# Patient Record
Sex: Female | Born: 1980 | Race: Black or African American | Hispanic: Yes | Marital: Single | Smoking: Never smoker
Health system: Southern US, Community
[De-identification: ages and names within clinical notes are randomized; demographics above are authoritative.]

## PROBLEM LIST (undated history)

## (undated) DIAGNOSIS — A630 Anogenital (venereal) warts: Secondary | ICD-10-CM

## (undated) DIAGNOSIS — R51 Headache: Secondary | ICD-10-CM

## (undated) DIAGNOSIS — I10 Essential (primary) hypertension: Secondary | ICD-10-CM

## (undated) DIAGNOSIS — K649 Unspecified hemorrhoids: Secondary | ICD-10-CM

## (undated) DIAGNOSIS — R519 Headache, unspecified: Secondary | ICD-10-CM

## (undated) DIAGNOSIS — F329 Major depressive disorder, single episode, unspecified: Secondary | ICD-10-CM

## (undated) DIAGNOSIS — K602 Anal fissure, unspecified: Secondary | ICD-10-CM

## (undated) DIAGNOSIS — D649 Anemia, unspecified: Secondary | ICD-10-CM

## (undated) DIAGNOSIS — F32A Depression, unspecified: Secondary | ICD-10-CM

## (undated) HISTORY — DX: Anal fissure, unspecified: K60.2

## (undated) HISTORY — PX: BREAST BIOPSY: SHX20

## (undated) HISTORY — PX: BREAST SURGERY: SHX581

## (undated) HISTORY — DX: Major depressive disorder, single episode, unspecified: F32.9

## (undated) HISTORY — DX: Depression, unspecified: F32.A

---

## 1999-02-12 DIAGNOSIS — K602 Anal fissure, unspecified: Secondary | ICD-10-CM

## 1999-02-12 HISTORY — PX: SIGMOIDOSCOPY: SUR1295

## 1999-02-12 HISTORY — DX: Anal fissure, unspecified: K60.2

## 2000-02-12 HISTORY — PX: WISDOM TOOTH EXTRACTION: SHX21

## 2003-08-22 ENCOUNTER — Inpatient Hospital Stay (HOSPITAL_COMMUNITY): Admission: AD | Admit: 2003-08-22 | Discharge: 2003-08-23 | Payer: Self-pay | Admitting: Psychiatry

## 2014-08-10 DIAGNOSIS — G43909 Migraine, unspecified, not intractable, without status migrainosus: Secondary | ICD-10-CM | POA: Insufficient documentation

## 2014-08-10 DIAGNOSIS — F319 Bipolar disorder, unspecified: Secondary | ICD-10-CM | POA: Insufficient documentation

## 2014-08-10 DIAGNOSIS — R748 Abnormal levels of other serum enzymes: Secondary | ICD-10-CM | POA: Insufficient documentation

## 2014-08-10 DIAGNOSIS — F329 Major depressive disorder, single episode, unspecified: Secondary | ICD-10-CM | POA: Insufficient documentation

## 2014-08-10 DIAGNOSIS — F32A Depression, unspecified: Secondary | ICD-10-CM | POA: Insufficient documentation

## 2014-08-10 DIAGNOSIS — B029 Zoster without complications: Secondary | ICD-10-CM | POA: Insufficient documentation

## 2014-08-12 ENCOUNTER — Ambulatory Visit (INDEPENDENT_AMBULATORY_CARE_PROVIDER_SITE_OTHER): Payer: Self-pay | Admitting: Family Medicine

## 2014-08-12 ENCOUNTER — Encounter: Payer: Self-pay | Admitting: Family Medicine

## 2014-08-12 VITALS — BP 122/80 | HR 70 | Temp 97.5°F | Resp 16 | Wt 165.8 lb

## 2014-08-12 DIAGNOSIS — H9203 Otalgia, bilateral: Secondary | ICD-10-CM

## 2014-08-12 NOTE — Progress Notes (Signed)
   Subjective:    Patient ID: Lisa Knox, female    DOB: August 10, 1980, 34 y.o.   MRN: [id number]  HPI Otalgia in right ear Sunday then moved to the left Monday. Been using OTC drops with relief of pain. Some itching remains. No hearing loss. Has continued Zyrtec for allergies (usually early spring symptoms). Temperature was up to 99.2 Tuesday (08-09-14).  Patient Active Problem List   Diagnosis Date Noted  . Affective bipolar disorder 08/10/2014  . Clinical depression 08/10/2014  . Abnormal liver enzymes 08/10/2014  . Herpes zona 08/10/2014  . Headache, migraine 08/10/2014   Past Surgical History  Procedure Laterality Date  . Wisdom tooth extraction  2002   History  Substance Use Topics  . Smoking status: Never Smoker   . Smokeless tobacco: Not on file  . Alcohol Use: 0.0 oz/week    0 Standard drinks or equivalent per week     Comment: OCCASIONALLY   Current Outpatient Prescriptions on File Prior to Visit  Medication Sig Dispense Refill  . lamoTRIgine (LAMICTAL) 100 MG tablet Take 2 tablets by mouth at bedtime.     No current facility-administered medications on file prior to visit.   Allergies  Allergen Reactions  . Pollen Extract Other (See Comments)    Pet dander- Hives  . Shellfish Allergy Hives       Review of Systems  Constitutional: Negative.   HENT: Positive for ear pain and hearing loss. Negative for ear discharge, postnasal drip, rhinorrhea and sneezing.   Eyes: Negative.   Respiratory: Negative.   Cardiovascular: Negative.   Gastrointestinal: Negative.       BP 122/80 mmHg  Pulse 70  Temp(Src) 97.5 F (36.4 C) (Oral)  Resp 16  Wt 165 lb 12.8 oz (75.206 kg)   Objective:   Physical Exam  Constitutional: She is oriented to person, place, and time. She appears well-developed and well-nourished. No distress.  HENT:  Head: Normocephalic and atraumatic.  Right Ear: Hearing normal.  Left Ear: Hearing normal.  Nose: Nose normal.  Eyes: Conjunctivae and  lids are normal. Right eye exhibits no discharge. Left eye exhibits no discharge. No scleral icterus.  Pulmonary/Chest: Effort normal. No respiratory distress.  Musculoskeletal: Normal range of motion.  Neurological: She is alert and oriented to person, place, and time.  Skin: Skin is intact. No lesion and no rash noted.  Psychiatric: She has a normal mood and affect. Her speech is normal and behavior is normal. Thought content normal.      Assessment & Plan:  1. Otalgia of both ears Recent sudden onset and quick recovery with use of OTC ear drops. Advised to watch for swimmer's ear and other congestion symptoms. May continue to use the drops, if needed, and use Zyrtec prn for allergy symptoms. Recheck prn.

## 2014-09-21 ENCOUNTER — Telehealth: Payer: Self-pay | Admitting: Family Medicine

## 2014-09-21 NOTE — Telephone Encounter (Signed)
Pt contacted office for refill request on the following medications: lamoTRIgine (LAMICTAL) 100 MG tablet [facility] in [place]. Thanks TNP

## 2014-09-21 NOTE — Telephone Encounter (Signed)
Please review. Thanks!  

## 2014-09-22 NOTE — Telephone Encounter (Signed)
Pt called back to see if this had been called in. Pt stated that she will be out tomorrow 09/23/14. Thanks TNP

## 2014-09-23 MED ORDER — LAMOTRIGINE 100 MG PO TABS: 200.0000 mg | ORAL_TABLET | Freq: Every day | ORAL | Status: DC

## 2014-10-03 ENCOUNTER — Ambulatory Visit (INDEPENDENT_AMBULATORY_CARE_PROVIDER_SITE_OTHER): Payer: Self-pay | Admitting: Family Medicine

## 2014-10-03 ENCOUNTER — Encounter: Payer: Self-pay | Admitting: Family Medicine

## 2014-10-03 VITALS — BP 110/68 | HR 64 | Temp 99.2°F | Resp 16 | Ht 66.0 in | Wt 168.0 lb

## 2014-10-03 DIAGNOSIS — F319 Bipolar disorder, unspecified: Secondary | ICD-10-CM

## 2014-10-03 DIAGNOSIS — R748 Abnormal levels of other serum enzymes: Secondary | ICD-10-CM

## 2014-10-03 MED ORDER — LAMOTRIGINE 100 MG PO TABS: 200.0000 mg | ORAL_TABLET | Freq: Every day | ORAL | Status: DC

## 2014-10-03 NOTE — Progress Notes (Signed)
Patient ID: Lisa Knox, female   DOB: 01-31-1981, 34 y.o.   MRN: [id number]        Patient: Lisa Knox Female    DOB: [birth year]   34 y.o.   MRN: [id number] Visit Date: 10/03/2014  Today's Provider: Margarita Rana, MD   Chief Complaint  Patient presents with  . Depression   Subjective:    HPI  Depression, Follow-up  She  was last seen for this 2 years ago. Changes made at last visit include none.   She reports excellent compliance with treatment. She is not having side effects.   She reports excellent tolerance of treatment. Current symptoms include: none She feels she is Improved since last visit.  ------------------------------------------------------------------------  Has not seen a gyn or had CPE in several years.      Allergies  Allergen Reactions  . Pollen Extract Other (See Comments)    Pet dander- Hives  . Shellfish Allergy Hives   Previous Medications   LAMOTRIGINE (LAMICTAL) 100 MG TABLET    Take 2 tablets (200 mg total) by mouth at bedtime.    Review of Systems  Social History  Substance Use Topics  . Smoking status: Never Smoker   . Smokeless tobacco: Never Used  . Alcohol Use: 0.0 oz/week    0 Standard drinks or equivalent per week     Comment: OCCASIONALLY   Objective:   BP 110/68 mmHg  Pulse 64  Temp(Src) 99.2 F (37.3 C) (Oral)  Resp 16  Ht 5\' 6"  (1.676 m)  Wt 168 lb (76.204 kg)  BMI 27.13 kg/m2  LMP 09/19/2014  Physical Exam  Constitutional: She is oriented to person, place, and time. She appears well-developed and well-nourished.  Cardiovascular: Normal rate.   Neurological: She is alert and oriented to person, place, and time.  Psychiatric: She has a normal mood and affect. Her behavior is normal. Judgment and thought content normal.        Assessment & Plan:     1. Bipolar affective disorder, most recent episode unspecified type, remission status unspecified Stable. Will continue medication and check labs.   Further plan  pending these results.   - TSH  2. Abnormal liver enzymes Mildly elevated in the past. Will recheck. Follow up for Wellness.  - Comprehensive metabolic panel - TSH - CBC with Differential/Platelet       Margarita Rana, MD  [facility]

## 2014-10-05 ENCOUNTER — Telehealth: Payer: Self-pay

## 2014-10-05 LAB — COMPREHENSIVE METABOLIC PANEL
ALBUMIN: 4.3 g/dL (ref 3.5–5.5)
ALT: 18 IU/L (ref 0–32)
AST: 18 IU/L (ref 0–40)
Albumin/Globulin Ratio: 1.1 (ref 1.1–2.5)
Alkaline Phosphatase: 80 IU/L (ref 39–117)
BUN / CREAT RATIO: 11 (ref 8–20)
BUN: 12 mg/dL (ref 6–20)
Bilirubin Total: 0.7 mg/dL (ref 0.0–1.2)
CO2: 23 mmol/L (ref 18–29)
CREATININE: 1.12 mg/dL — AB (ref 0.57–1.00)
Calcium: 9.5 mg/dL (ref 8.7–10.2)
Chloride: 96 mmol/L — ABNORMAL LOW (ref 97–108)
GFR calc Af Amer: 73 mL/min/{1.73_m2} (ref >59)
GFR calc non Af Amer: 63 mL/min/{1.73_m2} (ref >59)
GLUCOSE: 104 mg/dL — AB (ref 65–99)
Globulin, Total: 3.8 g/dL (ref 1.5–4.5)
Potassium: 4.9 mmol/L (ref 3.5–5.2)
Sodium: 137 mmol/L (ref 134–144)
Total Protein: 8.1 g/dL (ref 6.0–8.5)

## 2014-10-05 LAB — CBC WITH DIFFERENTIAL/PLATELET
BASOS ABS: 0 10*3/uL (ref 0.0–0.2)
Basos: 1 %
EOS (ABSOLUTE): 0.1 10*3/uL (ref 0.0–0.4)
Eos: 2 %
HEMOGLOBIN: 12.7 g/dL (ref 11.1–15.9)
Hematocrit: 38.2 % (ref 34.0–46.6)
IMMATURE GRANS (ABS): 0 10*3/uL (ref 0.0–0.1)
IMMATURE GRANULOCYTES: 0 %
Lymphocytes Absolute: 3 10*3/uL (ref 0.7–3.1)
Lymphs: 44 %
MCH: 29.3 pg (ref 26.6–33.0)
MCHC: 33.2 g/dL (ref 31.5–35.7)
MCV: 88 fL (ref 79–97)
MONOCYTES: 11 %
Monocytes Absolute: 0.7 10*3/uL (ref 0.1–0.9)
NEUTROS PCT: 42 %
Neutrophils Absolute: 2.8 10*3/uL (ref 1.4–7.0)
PLATELETS: 346 10*3/uL (ref 150–379)
RBC: 4.34 x10E6/uL (ref 3.77–5.28)
RDW: 13.7 % (ref 12.3–15.4)
WBC: 6.7 10*3/uL (ref 3.4–10.8)

## 2014-10-05 LAB — TSH: TSH: 1.57 u[IU]/mL (ref 0.450–4.500)

## 2014-10-05 NOTE — Telephone Encounter (Signed)
-----   Message from Margarita Rana, MD sent at 10/05/2014  6:45 AM EDT ----- Labs stable except glucose slightly elevated, but ok if not fasting. If not fasting, no need for further intervention. Thanks.

## 2014-10-05 NOTE — Telephone Encounter (Signed)
Advised pt of lab results. Pt verbally acknowledges understanding. Pt was NOT fasting. Advised pt glucose is fine. Renaldo Fiddler, CMA

## 2015-01-30 ENCOUNTER — Ambulatory Visit (INDEPENDENT_AMBULATORY_CARE_PROVIDER_SITE_OTHER): Payer: Self-pay | Admitting: Family Medicine

## 2015-01-30 ENCOUNTER — Encounter: Payer: Self-pay | Admitting: Family Medicine

## 2015-01-30 VITALS — BP 128/78 | HR 88 | Temp 99.2°F | Resp 16 | Ht 66.0 in | Wt 153.0 lb

## 2015-01-30 DIAGNOSIS — K629 Disease of anus and rectum, unspecified: Secondary | ICD-10-CM

## 2015-01-30 DIAGNOSIS — Z23 Encounter for immunization: Secondary | ICD-10-CM

## 2015-01-30 DIAGNOSIS — K602 Anal fissure, unspecified: Secondary | ICD-10-CM | POA: Insufficient documentation

## 2015-01-30 DIAGNOSIS — Z0000 Encounter for general adult medical examination without abnormal findings: Secondary | ICD-10-CM

## 2015-01-30 DIAGNOSIS — Z124 Encounter for screening for malignant neoplasm of cervix: Secondary | ICD-10-CM

## 2015-01-30 LAB — POCT URINALYSIS DIPSTICK
Bilirubin, UA: NEGATIVE
Blood, UA: NEGATIVE
Glucose, UA: NEGATIVE
KETONES UA: NEGATIVE
LEUKOCYTES UA: NEGATIVE
Nitrite, UA: NEGATIVE
PH UA: 6.5
Protein, UA: NEGATIVE
Spec Grav, UA: 1.01
Urobilinogen, UA: 0.2

## 2015-01-30 NOTE — Progress Notes (Signed)
Patient ID: Lisa Knox, female   DOB: Apr 22, 1980, 34 y.o.   MRN: [id number]         Patient: Lisa Knox, Female    DOB: 06-11-1980, 33 y.o.   MRN: [id number] Visit Date: 01/30/2015  Today's Provider: Margarita Rana, MD   Chief Complaint  Patient presents with  . Annual Exam   Subjective:    Annual physical exam Lisa Knox is a 34 y.o. female who presents today for health maintenance and complete physical. She feels fairly well. Having trouble with hemorrhoids.   She reports exercising regularly. She reports she is sleeping well.  -----------------------------------------------------------------   Review of Systems  Constitutional: Negative.   HENT: Positive for congestion and sinus pressure. Negative for dental problem, drooling, ear discharge, ear pain, facial swelling, hearing loss, mouth sores, nosebleeds, postnasal drip, rhinorrhea, sneezing, sore throat, tinnitus, trouble swallowing and voice change.   Eyes: Negative.   Respiratory: Negative.   Cardiovascular: Negative.   Gastrointestinal: Positive for anal bleeding and rectal pain. Negative for nausea, vomiting, abdominal pain, diarrhea, constipation, blood in stool and abdominal distention.  Endocrine: Negative.   Genitourinary: Negative.   Musculoskeletal: Negative.   Skin: Positive for rash. Negative for color change, pallor and wound.  Allergic/Immunologic: Positive for food allergies. Negative for environmental allergies and immunocompromised state.  Neurological: Positive for headaches. Negative for dizziness, tremors, seizures, syncope, facial asymmetry, speech difficulty, weakness, light-headedness and numbness.  Hematological: Negative.   Psychiatric/Behavioral: Negative.     Social History      She  reports that she has never smoked. She has never used smokeless tobacco. She reports that she drinks alcohol. She reports that she does not use illicit drugs.       Social History   Social History  . Marital  Status: Single    Spouse Name: N/A  . Number of Children: N/A  . Years of Education: N/A   Social History Main Topics  . Smoking status: Never Smoker   . Smokeless tobacco: Never Used  . Alcohol Use: 0.0 oz/week    0 Standard drinks or equivalent per week     Comment: OCCASIONALLY  . Drug Use: No  . Sexual Activity: Not Asked   Other Topics Concern  . None   Social History Narrative    Patient Active Problem List   Diagnosis Date Noted  . Affective bipolar disorder ([place]) 08/10/2014  . Clinical depression 08/10/2014  . Abnormal liver enzymes 08/10/2014  . Herpes zona 08/10/2014  . Headache, migraine 08/10/2014    Past Surgical History  Procedure Laterality Date  . Wisdom tooth extraction  2002    Family History        Family Status  Relation Status Death Age  . Mother Alive   . Father Alive   . Brother Alive   . Maternal Grandmother Deceased 64  . Maternal Grandfather Deceased   . Paternal Grandmother Deceased 9  . Paternal Grandfather Deceased 69  . Brother Deceased         Her family history includes Colon polyps in her father; Congestive Heart Failure in her maternal grandmother; Crohn's disease in her father; Dementia in her paternal grandfather; Depression in her mother; Diabetes in her paternal grandmother; GER disease in her mother; Gallbladder disease in her father; Hypertension in her brother and mother; Thyroid disease in her brother.    Allergies  Allergen Reactions  . Pollen Extract Other (See Comments)    Pet dander- Hives  . Shellfish Allergy Hives  Previous Medications   LAMOTRIGINE (LAMICTAL) 100 MG TABLET    Take 2 tablets (200 mg total) by mouth at bedtime.    Patient Care Team: Margarita Rana, MD as PCP - General (Family Medicine)     Objective:   Vitals: BP 128/78 mmHg  Pulse 88  Temp(Src) 99.2 F (37.3 C) (Oral)  Resp 16  Ht 5\' 6"  (1.676 m)  Wt 153 lb (69.4 kg)  BMI 24.71 kg/m2  LMP 01/23/2015 (Exact Date)   Physical  Exam  Constitutional: She is oriented to person, place, and time. She appears well-developed and well-nourished.  HENT:  Head: Normocephalic and atraumatic.  Right Ear: External ear normal.  Left Ear: External ear normal.  Nose: Nose normal.  Mouth/Throat: Oropharynx is clear and moist.  Eyes: Conjunctivae and EOM are normal. Pupils are equal, round, and reactive to light.  Neck: Normal range of motion. Neck supple. Carotid bruit is not present.  Cardiovascular: Normal rate, regular rhythm and normal heart sounds.   Pulmonary/Chest: Effort normal and breath sounds normal.  Abdominal: Soft. Bowel sounds are normal.  Genitourinary: Vagina normal and uterus normal. No breast swelling, tenderness, discharge or bleeding.  Rectum with inflamed, thickened tissue on left.     Musculoskeletal: Normal range of motion.  Neurological: She is alert and oriented to person, place, and time. She has normal reflexes.  Skin: Skin is warm and dry.  Psychiatric: She has a normal mood and affect. Her behavior is normal. Judgment and thought content normal.     Depression Screen PHQ 2/9 Scores 01/30/2015  PHQ - 2 Score 0      Assessment & Plan:     Routine Health Maintenance and Physical Exam  Exercise Activities and Dietary recommendations Goals    None      There is no immunization history for the selected administration types on file for this patient.  Health Maintenance  Topic Date Due  . HIV Screening  08/12/1993  . TETANUS/TDAP  08/12/1997  . PAP SMEAR  08/13/1999  . INFLUENZA VACCINE  09/12/2014      Discussed health benefits of physical activity, and encouraged her to engage in regular exercise appropriate for her age and condition.   1. Annual physical exam Stable, as above.  - POCT urinalysis dipstick Results for orders placed or performed in visit on 01/30/15  POCT urinalysis dipstick  Result Value Ref Range   Color, UA Yellow    Clarity, UA Clear    Glucose, UA  Negative    Bilirubin, UA Negative    Ketones, UA Negative    Spec Grav, UA 1.010    Blood, UA Negative    pH, UA 6.5    Protein, UA Negative    Urobilinogen, UA 0.2    Nitrite, UA Negative    Leukocytes, UA Negative Negative  .  2. Cervical cancer screening - Pap IG and HPV (high risk) DNA detection  3. Flu vaccine need Given today.  - Flu Vaccine QUAD 36+ mos PF IM (Fluarix & Fluzone Quad PF)  4. Anal lesion History of fissure, usual exam on near left rectum.   - Ambulatory referral to General Surgery   Patient was seen and examined by Jerrell Belfast, MD, and note scribed by Ashley Royalty, CMA.  I have reviewed the document for accuracy and completeness and I agree with above. Jerrell Belfast, MD   Margarita Rana, MD    --------------------------------------------------------------------

## 2015-02-03 ENCOUNTER — Telehealth: Payer: Self-pay

## 2015-02-03 LAB — PAP IG AND HPV HIGH-RISK
HPV, high-risk: NEGATIVE
PAP Smear Comment: 0

## 2015-02-03 NOTE — Telephone Encounter (Signed)
LMTCB 02/03/2015  Thanks,   -Mickel Baas

## 2015-02-03 NOTE — Telephone Encounter (Signed)
-----   Message from Margarita Rana, MD sent at 02/03/2015  7:29 AM EST ----- Pap is normal. Please notify patient.   Thanks.

## 2015-02-03 NOTE — Telephone Encounter (Signed)
Pt informed and voiced understanding of results. 

## 2015-02-07 ENCOUNTER — Ambulatory Visit (INDEPENDENT_AMBULATORY_CARE_PROVIDER_SITE_OTHER): Payer: PRIVATE HEALTH INSURANCE | Admitting: General Surgery

## 2015-02-07 ENCOUNTER — Encounter: Payer: Self-pay | Admitting: General Surgery

## 2015-02-07 VITALS — BP 120/74 | HR 78 | Resp 12 | Ht 66.0 in | Wt 151.0 lb

## 2015-02-07 DIAGNOSIS — K602 Anal fissure, unspecified: Secondary | ICD-10-CM | POA: Diagnosis not present

## 2015-02-07 DIAGNOSIS — K644 Residual hemorrhoidal skin tags: Secondary | ICD-10-CM | POA: Diagnosis not present

## 2015-02-07 NOTE — Patient Instructions (Addendum)
The patient is aware to call back for any questions or concerns. Anal Fissure, Adult An anal fissure is a small tear or crack in the skin around the anus. Bleeding from a fissure usually stops on its own within a few minutes. However, bleeding will often occur again with each bowel movement until the crack heals. CAUSES This condition may be caused by:  Passing large, hard stool (feces).  Frequent diarrhea.  Constipation.  Inflammatory bowel disease (Crohn disease or ulcerative colitis).  Infections.  Anal sex. SYMPTOMS Symptoms of this condition include:  Bleeding from the rectum.  Small amounts of blood seen on your stool, on toilet paper, or in the toilet after a bowel movement.  Painful bowel movements.  Itching or irritation around the anus. DIAGNOSIS A health care provider may diagnose this condition by closely examining the anal area. An anal fissure can usually be seen with careful inspection. In some cases, a rectal exam may be performed, or a short tube (anoscope) may be used to examine the anal canal. TREATMENT Treatment for this condition may include:  Taking steps to avoid constipation. This may include making changes to your diet, such as increasing your intake of fiber or fluid.  Taking fiber supplements. These supplements can soften your stool to help make bowel movements easier. Your health care provider may also prescribe a stool softener if your stool is often hard.  Taking sitz baths. This may help to heal the tear.  Using medicated creams or ointments. These may be prescribed to lessen discomfort. HOME CARE INSTRUCTIONS Eating and Drinking  Avoid foods that may be constipating, such as bananas and dairy products.  Drink enough fluid to keep your urine clear or pale yellow.  Maintain a diet that is high in fruits, whole grains, and vegetables. General Instructions  Keep the anal area as clean and dry as possible.  Take sitz baths as told by your  health care provider. Do not use soap in the sitz baths.  Take over-the-counter and prescription medicines only as told by your health care provider.  Use creams or ointments only as told by your health care provider.  Keep all follow-up visits as told by your health care provider. This is important. SEEK MEDICAL CARE IF:  You have more bleeding.  You have a fever.  You have diarrhea that is mixed with blood.  You continue to have pain.  Your problem is getting worse rather than better.   This information is not intended to replace advice given to you by your health care provider. Make sure you discuss any questions you have with your health care provider.   Document Released: 01/28/2005 Document Revised: 10/19/2014 Document Reviewed: 04/25/2014 Elsevier Interactive Patient Education 2016 [employer].   Patient's surgery has been scheduled for 03-03-15 at [facility].

## 2015-02-07 NOTE — Progress Notes (Addendum)
Patient ID: Lisa Knox, female   DOB: [birth year], 34 y.o.   MRN: [id number]  Chief Complaint  Patient presents with  . Rectal Problems    anal lesion    HPI Lisa Knox is a 34 y.o. female.  Here today for evaluation of a possible anal lesion or fissure. She has noticied some bleeding during the summer. The bleeding lasted 2-3 days. The most recent bleeding was yesterday. She has had an anal fissutre while in college noticed more when she was dehydrated and constipated. Bowels move daily. Denies rectal pain. She denies any abdominal issues. I have reviewed the history of present illness with the patient. HPI  Past Medical History  Diagnosis Date  . Depression   . Anal fissure 2001    Past Surgical History  Procedure Laterality Date  . Wisdom tooth extraction  2002  . Sigmoidoscopy  2001    Family History  Problem Relation Age of Onset  . Depression Mother   . Hypertension Mother   . GER disease Mother   . Colon polyps Father   . Gallbladder disease Father   . Crohn's disease Father   . Hypertension Brother   . Congestive Heart Failure Maternal Grandmother   . Diabetes Paternal Grandmother   . Dementia Paternal Grandfather   . Thyroid disease Brother     Social History Social History  Substance Use Topics  . Smoking status: Never Smoker   . Smokeless tobacco: Never Used  . Alcohol Use: 0.0 oz/week    0 Standard drinks or equivalent per week     Comment: OCCASIONALLY    Allergies  Allergen Reactions  . Pollen Extract Other (See Comments)    Pet dander- Hives  . Shellfish Allergy Hives    Current Outpatient Prescriptions  Medication Sig Dispense Refill  . lamoTRIgine (LAMICTAL) 100 MG tablet Take 2 tablets (200 mg total) by mouth at bedtime. 180 tablet 3   No current facility-administered medications for this visit.    Review of Systems Review of Systems  Constitutional: Negative.   Respiratory: Negative.   Cardiovascular: Negative.     Blood  pressure 120/74, pulse 78, resp. rate 12, height 5\' 6"  (1.676 m), weight 151 lb (68.493 kg), last menstrual period 01/23/2015.  Physical Exam Physical Exam  Constitutional: She is oriented to person, place, and time. She appears well-developed and well-nourished.  HENT:  Mouth/Throat: Oropharynx is clear and moist.  Eyes: Conjunctivae are normal. No scleral icterus.  Neck: Neck supple.  Cardiovascular: Normal rate, regular rhythm and normal heart sounds.   Pulmonary/Chest: Effort normal and breath sounds normal.  Abdominal: Soft. Normal appearance and bowel sounds are normal. There is no tenderness.  Genitourinary: Rectal exam shows fissure and anal tone abnormal.  Thickened anal skin tags 4 o'clock and 12 o'clock. Internal anterior anal fissure. Anal  tone is increased.  Neurological: She is alert and oriented to person, place, and time.  Skin: Skin is warm and dry.  Psychiatric: Her behavior is normal.   Anoscopy was performed since fissure could not be seen easily. 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 Rectal mucosa is normal.   Data Reviewed Progress notes.  Assessment    Anal fissure with multiple anal tags.    Plan    Discussed risk and benefits and recommend anal fissurotomy and sphinctectomy with excision of multiple anal tags. She is agreeable.    Patient's surgery has been scheduled for 03-03-15 at [facility]  This information has been scribed by Karie Fetch RNBC.  PCP:  Otho Darner 02/07/2015, 4:05 PM

## 2015-02-16 ENCOUNTER — Other Ambulatory Visit: Payer: Self-pay

## 2015-02-27 ENCOUNTER — Encounter: Payer: Self-pay | Admitting: *Deleted

## 2015-02-27 NOTE — Patient Instructions (Signed)
  Your procedure is scheduled on: 03-03-15  Report to [place] To find out your arrival time please call [phone] between 1PM - 3PM on 03-02-15   Remember: Instructions that are not followed completely may result in serious medical risk, up to and including death, or upon the discretion of your surgeon and anesthesiologist your surgery may need to be rescheduled.    _X___ 1. Do not eat food or drink liquids after midnight. No gum chewing or hard candies.     _X___ 2. No Alcohol for 24 hours before or after surgery.   ____ 3. Bring all medications with you on the day of surgery if instructed.    ____ 4. Notify your doctor if there is any change in your medical condition     (cold, fever, infections).     Do not wear jewelry, make-up, hairpins, clips or nail polish.  Do not wear lotions, powders, or perfumes. You may wear deodorant.  Do not shave 48 hours prior to surgery. Men may shave face and neck.  Do not bring valuables to the hospital.    [facility] is not responsible for any belongings or valuables.               Contacts, dentures or bridgework may not be worn into surgery.  Leave your suitcase in the car. After surgery it may be brought to your room.  For patients admitted to the hospital, discharge time is determined by your treatment team.   Patients discharged the day of surgery will not be allowed to drive home.   Please read over the following fact sheets that you were given:      ____ Take these medicines the morning of surgery with A SIP OF WATER:    1. NONE  2.   3.   4.  5.  6.  _X___ Fleet Enema (as directed)-DO ENEMA 1 HOUR PRIOR TO ARRIVAL TIME TO HOSPITAL  ____ Use CHG Soap as directed  ____ Use inhalers on the day of surgery  ____ Stop metformin 2 days prior to surgery    ____ Take 1/2 of usual insulin dose the night before surgery and none on the morning of surgery.   ____ Stop  Coumadin/Plavix/aspirin-N/A  ____ Stop Anti-inflammatories-NO NSAIDS OR ASA PRODUCTS-TYLENOL OK TO TAKE   ____ Stop supplements until after surgery.    ____ Bring C-Pap to the hospital.

## 2015-03-03 ENCOUNTER — Ambulatory Visit: Admission: RE | Admit: 2015-03-03 | Payer: Self-pay | Source: Ambulatory Visit | Admitting: General Surgery

## 2015-03-03 HISTORY — DX: Headache: R51

## 2015-03-03 HISTORY — DX: Headache, unspecified: R51.9

## 2015-03-03 SURGERY — ANAL FISTULOTOMY
Anesthesia: Choice

## 2015-10-12 ENCOUNTER — Other Ambulatory Visit: Payer: Self-pay | Admitting: Family Medicine

## 2015-10-12 NOTE — Telephone Encounter (Signed)
Pt contacted office for refill request on the following medications:  lamoTRIgine (LAMICTAL) 100 MG tablet.  [place].  CB#[phone]/MW

## 2015-10-13 ENCOUNTER — Telehealth: Payer: Self-pay | Admitting: Family Medicine

## 2015-10-13 ENCOUNTER — Other Ambulatory Visit: Payer: Self-pay | Admitting: Family Medicine

## 2015-10-13 NOTE — Telephone Encounter (Signed)
error 

## 2015-10-19 ENCOUNTER — Encounter: Payer: Self-pay | Admitting: Family Medicine

## 2015-10-19 ENCOUNTER — Ambulatory Visit (INDEPENDENT_AMBULATORY_CARE_PROVIDER_SITE_OTHER): Payer: Self-pay | Admitting: Family Medicine

## 2015-10-19 VITALS — BP 118/88 | HR 72 | Temp 98.6°F | Wt 138.6 lb

## 2015-10-19 DIAGNOSIS — F3170 Bipolar disorder, currently in remission, most recent episode unspecified: Secondary | ICD-10-CM

## 2015-10-19 MED ORDER — LAMOTRIGINE 100 MG PO TABS: 200.0000 mg | ORAL_TABLET | Freq: Every day | ORAL | 6 refills | Status: DC

## 2015-10-19 NOTE — Progress Notes (Signed)
Patient: Lisa Knox Female    DOB: 11/23/1980   35 y.o.   MRN: [id number] Visit Date: 10/19/2015  Today's Provider: [name], PA   Chief Complaint  Patient presents with  . Manic Behavior   Subjective:    HPI  Bipolar Disorder Follow Up: Patient is here for follow up and establish care with Lisa Knox since [name] left. Patient is doing well on current medication regimen. Patient needs refills. Tolerating Lamictal  Past Medical History:  Diagnosis Date  . Anal fissure 2001  . Depression   . Headache    H/O   Patient Active Problem List   Diagnosis Date Noted  . Anal lesion 01/30/2015  . Affective bipolar disorder ([place]) 08/10/2014  . Clinical depression 08/10/2014  . Abnormal liver enzymes 08/10/2014  . Herpes zona 08/10/2014  . Headache, migraine 08/10/2014   Past Surgical History:  Procedure Laterality Date  . SIGMOIDOSCOPY  2001  . WISDOM TOOTH EXTRACTION  2002   Family History  Problem Relation Age of Onset  . Depression Mother   . Hypertension Mother   . GER disease Mother   . Colon polyps Father   . Gallbladder disease Father   . Crohn's disease Father   . Hypertension Brother   . Congestive Heart Failure Maternal Grandmother   . Diabetes Paternal Grandmother   . Dementia Paternal Grandfather   . Thyroid disease Brother    Allergies  Allergen Reactions  . Pollen Extract Other (See Comments)    Pet dander- Hives  . Shellfish Allergy Hives   Previous Medications   LAMOTRIGINE (LAMICTAL) 100 MG TABLET    Take 2 tablets (200 mg total) by mouth at bedtime.   MULTIPLE VITAMIN (MULTIVITAMIN) TABLET    Take 1 tablet by mouth daily.   Review of Systems  Constitutional: Negative.   Respiratory: Negative.   Cardiovascular: Negative.   Psychiatric/Behavioral: Negative.    Social History  Substance Use Topics  . Smoking status: Never Smoker  . Smokeless tobacco: Never Used  . Alcohol use 0.0 oz/week     Comment: OCCASIONALLY    Objective:   BP 118/88 (BP Location: Right Arm, Patient Position: Sitting, Cuff Size: Normal)   Pulse 72   Temp 98.6 F (37 C) (Oral)   Wt 138 lb 9.6 oz (62.9 kg)   LMP 02/17/2015 (Exact Date)   BMI 22.37 kg/m   Physical Exam  Constitutional: She is oriented to person, place, and time. She appears well-developed and well-nourished. No distress.  HENT:  Head: Normocephalic and atraumatic.  Right Ear: Hearing normal.  Left Ear: Hearing normal.  Nose: Nose normal.  Eyes: Conjunctivae and lids are normal. Right eye exhibits no discharge. Left eye exhibits no discharge. No scleral icterus.  Pulmonary/Chest: Effort normal. No respiratory distress.  Musculoskeletal: Normal range of motion.  Neurological: She is alert and oriented to person, place, and time.  Skin: Skin is intact. No lesion and no rash noted.  Psychiatric: She has a normal mood and affect. Her speech is normal and behavior is normal. Thought content normal.      Assessment & Plan:     1. Bipolar disorder in partial remission, most recent episode unspecified type ([place]) Stable and well controlled with use of Lamictal daily. Tolerating medication without side effects. Does not have insurance to cover counseling or follow up with psychiatrist. Will schedule CPE in 4-6 months. - lamoTRIgine (LAMICTAL) 100 MG tablet; Take 2 tablets (200 mg total) by mouth at bedtime.  Dispense: 60 tablet; Refill: 6

## 2016-10-20 ENCOUNTER — Other Ambulatory Visit: Payer: Self-pay | Admitting: Family Medicine

## 2016-10-20 DIAGNOSIS — F3170 Bipolar disorder, currently in remission, most recent episode unspecified: Secondary | ICD-10-CM

## 2016-12-13 ENCOUNTER — Encounter: Payer: Self-pay | Admitting: Family Medicine

## 2016-12-20 ENCOUNTER — Encounter: Payer: Self-pay | Admitting: Family Medicine

## 2017-01-13 ENCOUNTER — Ambulatory Visit (INDEPENDENT_AMBULATORY_CARE_PROVIDER_SITE_OTHER): Payer: BC Managed Care – PPO | Admitting: Family Medicine

## 2017-01-13 ENCOUNTER — Encounter: Payer: Self-pay | Admitting: Family Medicine

## 2017-01-13 VITALS — BP 124/60 | HR 76 | Temp 98.4°F | Resp 16 | Ht 66.0 in | Wt 145.0 lb

## 2017-01-13 DIAGNOSIS — K602 Anal fissure, unspecified: Secondary | ICD-10-CM

## 2017-01-13 DIAGNOSIS — Z114 Encounter for screening for human immunodeficiency virus [HIV]: Secondary | ICD-10-CM

## 2017-01-13 DIAGNOSIS — Z23 Encounter for immunization: Secondary | ICD-10-CM

## 2017-01-13 DIAGNOSIS — Z0000 Encounter for general adult medical examination without abnormal findings: Secondary | ICD-10-CM

## 2017-01-13 LAB — LIPID PANEL
CHOL/HDL RATIO: 2.1 (calc) (ref <5.0)
Cholesterol: 165 mg/dL (ref <200)
HDL: 78 mg/dL (ref >50)
LDL CHOLESTEROL (CALC): 73 mg/dL
NON-HDL CHOLESTEROL (CALC): 87 mg/dL (ref <130)
Triglycerides: 61 mg/dL (ref <150)

## 2017-01-13 LAB — COMPLETE METABOLIC PANEL WITH GFR
AG Ratio: 1 (calc) (ref 1.0–2.5)
ALKALINE PHOSPHATASE (APISO): 58 U/L (ref 33–115)
ALT: 10 U/L (ref 6–29)
AST: 14 U/L (ref 10–30)
Albumin: 4.2 g/dL (ref 3.6–5.1)
BILIRUBIN TOTAL: 1.1 mg/dL (ref 0.2–1.2)
BUN: 18 mg/dL (ref 7–25)
CHLORIDE: 104 mmol/L (ref 98–110)
CO2: 28 mmol/L (ref 20–32)
CREATININE: 0.99 mg/dL (ref 0.50–1.10)
Calcium: 9.1 mg/dL (ref 8.6–10.2)
GFR, Est African American: 84 mL/min/{1.73_m2} (ref >=60)
GFR, Est Non African American: 72 mL/min/{1.73_m2} (ref >=60)
GLUCOSE: 87 mg/dL (ref 65–99)
Globulin: 4.2 g/dL (calc) — ABNORMAL HIGH (ref 1.9–3.7)
Potassium: 4.2 mmol/L (ref 3.5–5.3)
SODIUM: 141 mmol/L (ref 135–146)
Total Protein: 8.4 g/dL — ABNORMAL HIGH (ref 6.1–8.1)

## 2017-01-13 LAB — HIV ANTIBODY (ROUTINE TESTING W REFLEX): HIV 1&2 Ab, 4th Generation: NONREACTIVE

## 2017-01-13 LAB — CBC
HEMATOCRIT: 39 % (ref 35.0–45.0)
HEMOGLOBIN: 13.5 g/dL (ref 11.7–15.5)
MCH: 31.3 pg (ref 27.0–33.0)
MCHC: 34.6 g/dL (ref 32.0–36.0)
MCV: 90.3 fL (ref 80.0–100.0)
MPV: 10.3 fL (ref 7.5–12.5)
Platelets: 259 10*3/uL (ref 140–400)
RBC: 4.32 10*6/uL (ref 3.80–5.10)
RDW: 11.5 % (ref 11.0–15.0)
WBC: 4.3 10*3/uL (ref 3.8–10.8)

## 2017-01-13 NOTE — Progress Notes (Signed)
Patient: Lisa Knox, Female    DOB: 05-15-1980, 37 y.o.   MRN: [id number] Visit Date: 01/13/2017  Today's Provider: [name], MD   Chief Complaint  Patient presents with  . Annual Exam   Subjective:    Annual physical exam Lisa Knox is a 36 y.o. female who presents today for health maintenance and complete physical. She feels well. She reports she is not exercising regularly, but does make sure to always take the stairs. She reports she is sleeping well.  She does have anal fissures, which causes rectal bleeding. She was supposed to have surgery to correct this in January of this year, but canceled this due to insurance issues.  States that does bleed intermittently with constipation and straining, but no longer painful and much improved.  States she does not want to have surgery.  She has been told by her previous PCP that she has "dense breasts", and was supposed to get mammogram at the time of her last CPE. This was not performed, due to insurance.  Last pap- 01/30/2015- NIL; HPV negative.  Sexually active with boyfriend (only partner ever).  Using condoms.  Not trying to get pregnant, but would not be upset to get pregnant if it happened. -----------------------------------------------------------------   Review of Systems  Constitutional: Negative.   HENT: Positive for postnasal drip, rhinorrhea and sinus pain. Negative for congestion, dental problem, drooling, ear discharge, ear pain, facial swelling, hearing loss, mouth sores, nosebleeds, sinus pressure, sneezing, sore throat, tinnitus, trouble swallowing and voice change.   Eyes: Negative.   Respiratory: Negative.   Cardiovascular: Negative.   Gastrointestinal: Positive for blood in stool. Negative for abdominal distention, abdominal pain, anal bleeding, constipation, diarrhea, nausea, rectal pain and vomiting.  Genitourinary: Negative.   Musculoskeletal: Negative.   Skin: Negative.     Allergic/Immunologic: Positive for food allergies. Negative for environmental allergies and immunocompromised state.  Neurological: Negative.   Hematological: Negative.   Psychiatric/Behavioral: Negative.     Social History      She  reports that  has never smoked. she has never used smokeless tobacco. She reports that she drinks alcohol. She reports that she does not use drugs.       Social History   Socioeconomic History  . Marital status: Single    Spouse name: Not on file  . Number of children: 0  . Years of education: 74  . Highest education level: Master's degree (e.g., MA, MS, MEng, MEd, MSW, MBA)  Social Needs  . Financial resource strain: Not hard at all  . Food insecurity - worry: Never true  . Food insecurity - inability: Never true  . Transportation needs - medical: No  . Transportation needs - non-medical: No  Occupational History    Employer: [employer]  Tobacco Use  . Smoking status: Never Smoker  . Smokeless tobacco: Never Used  Substance and Sexual Activity  . Alcohol use: Yes    Alcohol/week: 0.0 oz    Comment: OCCASIONALLY  . Drug use: No  . Sexual activity: Yes    Birth control/protection: None  Other Topics Concern  . Not on file  Social History Narrative  . Not on file    Past Medical History:  Diagnosis Date  . Anal fissure 2001  . Depression   . Headache    H/O     Patient Active Problem List   Diagnosis Date Noted  . Anal fissure 01/30/2015  . Affective bipolar  disorder ([place]) 08/10/2014  . Clinical depression 08/10/2014  . Abnormal liver enzymes 08/10/2014  . Herpes zona 08/10/2014  . Headache, migraine 08/10/2014    Past Surgical History:  Procedure Laterality Date  . SIGMOIDOSCOPY  2001  . [place] EXTRACTION  2002    Family History        Family Status  Relation Name Status  . Mother  Alive  . Father  Alive  . Brother 1 Alive  . MGM  Deceased at age 47  . MGF  Deceased  . PGM  Deceased at age  54  . PGF  Deceased at age 64  . Brother 2 Alive        Her family history includes Colon polyps in her father; Congestive Heart Failure in her maternal grandmother; Crohn's disease in her father; Dementia in her paternal grandfather; Diabetes in her paternal grandmother; GER disease in her mother; Gallbladder disease in her father; Hypertension in her brother and mother; Thyroid disease in her brother.     Allergies  Allergen Reactions  . Pollen Extract Other (See Comments)    Pet dander- Hives  . Shellfish Allergy Hives     Current Outpatient Medications:  .  fluticasone (FLONASE) 50 MCG/ACT nasal spray, Place into both nostrils daily., Disp: , Rfl:  .  lamoTRIgine (LAMICTAL) 100 MG tablet, TAKE TWO TABLETS BY MOUTH AT BEDTIME, Disp: 60 tablet, Rfl: 2 .  Multiple Vitamin (MULTIVITAMIN) tablet, Take 1 tablet by mouth daily., Disp: , Rfl:    Patient Care Team: Virginia Crews, MD as PCP - General (Family Medicine) Christene Lye, MD (General Surgery)      Objective:   Vitals: BP 124/60 (BP Location: Left Arm, Patient Position: Sitting, Cuff Size: Normal)   Pulse 76   Temp 98.4 F (36.9 C) (Oral)   Resp 16   Ht 5\' 6"  (1.676 m)   Wt 145 lb (65.8 kg)   LMP 12/18/2016   BMI 23.40 kg/m    Vitals:   01/13/17 0912  BP: 124/60  Pulse: 76  Resp: 16  Temp: 98.4 F (36.9 C)  TempSrc: Oral  Weight: 145 lb (65.8 kg)  Height: 5\' 6"  (1.676 m)     Physical Exam  Constitutional: She is oriented to person, place, and time. She appears well-developed and well-nourished. No distress.  HENT:  Head: Normocephalic and atraumatic.  Right Ear: External ear normal.  Left Ear: External ear normal.  Nose: Nose normal.  Mouth/Throat: Oropharynx is clear and moist.  Eyes: Conjunctivae and EOM are normal. Pupils are equal, round, and reactive to light. No scleral icterus.  Neck: Neck supple. No thyromegaly present.  Cardiovascular: Normal rate, regular rhythm, normal heart  sounds and intact distal pulses.  No murmur heard. Pulmonary/Chest: Effort normal and breath sounds normal. No respiratory distress. She has no wheezes. She has no rales.  Breasts: breasts appear normal, no suspicious masses, no skin or nipple changes or axillary nodes.  Abdominal: Soft. Bowel sounds are normal. She exhibits no distension. There is no tenderness. There is no rebound and no guarding.  Musculoskeletal: She exhibits no edema or deformity.  Lymphadenopathy:    She has no cervical adenopathy.  Neurological: She is alert and oriented to person, place, and time.  Skin: Skin is warm and dry. No rash noted.  Psychiatric: She has a normal mood and affect. Her behavior is normal.  Vitals reviewed.    Depression Screen PHQ 2/9 Scores 01/13/2017 01/30/2015  PHQ - 2  Score 0 0      Assessment & Plan:     Routine Health Maintenance and Physical Exam  Exercise Activities and Dietary recommendations Goals    None      Immunization History  Administered Date(s) Administered  . Influenza,inj,Quad PF,6+ Mos 01/30/2015, 01/13/2017  . Tdap 10/28/2011    Health Maintenance  Topic Date Due  . HIV Screening  08/12/1993  . PAP SMEAR  01/29/2018  . TETANUS/TDAP  10/27/2021  . INFLUENZA VACCINE  Completed     Discussed health benefits of physical activity, and encouraged her to engage in regular exercise appropriate for her age and condition.    Problem List Items Addressed This Visit      Digestive   Anal fissure    Continue conservative treatment as it is improving Discussed measure to avoid constipation and stay regular       Other Visit Diagnoses    Encounter for annual physical exam    -  Primary   Relevant Orders   Comprehensive metabolic panel   CBC   Lipid panel   HIV antibody (with reflex)   Flu vaccine need       Relevant Orders   Flu Vaccine QUAD 36+ mos IM (Completed)   Encounter for screening for HIV       Relevant Orders   HIV antibody (with  reflex)      -------------------------------------------------------------------- Return in about 1 year (around 01/13/2018) for physical.   The entirety of the information documented in the History of Present Illness, Review of Systems and Physical Exam were personally obtained by me. Portions of this information were initially documented by Raquel Sarna Ratchford, CMA and reviewed by me for thoroughness and accuracy.     [name], MD  [facility]

## 2017-01-13 NOTE — Patient Instructions (Signed)
Preventive Care 18-39 Years, Female Preventive care refers to lifestyle choices and visits with your health care provider that can promote health and wellness. What does preventive care include?  A yearly physical exam. This is also called an annual well check.  Dental exams once or twice a year.  Routine eye exams. Ask your health care provider how often you should have your eyes checked.  Personal lifestyle choices, including: ? Daily care of your teeth and gums. ? Regular physical activity. ? Eating a healthy diet. ? Avoiding tobacco and drug use. ? Limiting alcohol use. ? Practicing safe sex. ? Taking vitamin and mineral supplements as recommended by your health care provider. What happens during an annual well check? The services and screenings done by your health care provider during your annual well check will depend on your age, overall health, lifestyle risk factors, and family history of disease. Counseling Your health care provider may ask you questions about your:  Alcohol use.  Tobacco use.  Drug use.  Emotional well-being.  Home and relationship well-being.  Sexual activity.  Eating habits.  Work and work Statistician.  Method of birth control.  Menstrual cycle.  Pregnancy history.  Screening You may have the following tests or measurements:  Height, weight, and BMI.  Diabetes screening. This is done by checking your blood sugar (glucose) after you have not eaten for a while (fasting).  Blood pressure.  Lipid and cholesterol levels. These may be checked every 5 years starting at age 66.  Skin check.  Hepatitis C blood test.  Hepatitis B blood test.  Sexually transmitted disease (STD) testing.  BRCA-related cancer screening. This may be done if you have a family history of breast, ovarian, tubal, or peritoneal cancers.  Pelvic exam and Pap test. This may be done every 3 years starting at age 40. Starting at age 59, this may be done every 5  years if you have a Pap test in combination with an HPV test.  Discuss your test results, treatment options, and if necessary, the need for more tests with your health care provider. Vaccines Your health care provider may recommend certain vaccines, such as:  Influenza vaccine. This is recommended every year.  Tetanus, diphtheria, and acellular pertussis (Tdap, Td) vaccine. You may need a Td booster every 10 years.  Varicella vaccine. You may need this if you have not been vaccinated.  HPV vaccine. If you are 69 or younger, you may need three doses over 6 months.  Measles, mumps, and rubella (MMR) vaccine. You may need at least one dose of MMR. You may also need a second dose.  Pneumococcal 13-valent conjugate (PCV13) vaccine. You may need this if you have certain conditions and were not previously vaccinated.  Pneumococcal polysaccharide (PPSV23) vaccine. You may need one or two doses if you smoke cigarettes or if you have certain conditions.  Meningococcal vaccine. One dose is recommended if you are age 27-21 years and a first-year college student living in a residence hall, or if you have one of several medical conditions. You may also need additional booster doses.  Hepatitis A vaccine. You may need this if you have certain conditions or if you travel or work in places where you may be exposed to hepatitis A.  Hepatitis B vaccine. You may need this if you have certain conditions or if you travel or work in places where you may be exposed to hepatitis B.  Haemophilus influenzae type b (Hib) vaccine. You may need this if  you have certain risk factors.  Talk to your health care provider about which screenings and vaccines you need and how often you need them. This information is not intended to replace advice given to you by your health care provider. Make sure you discuss any questions you have with your health care provider. Document Released: 03/26/2001 Document Revised: 10/18/2015  Document Reviewed: 11/29/2014 Elsevier Interactive Patient Education  2017 [employer].

## 2017-01-13 NOTE — Assessment & Plan Note (Signed)
Continue conservative treatment as it is improving Discussed measure to avoid constipation and stay regular

## 2017-01-14 ENCOUNTER — Telehealth: Payer: Self-pay

## 2017-01-14 NOTE — Telephone Encounter (Signed)
-----   Message from Virginia Crews, MD sent at 01/14/2017  9:08 AM EST ----- Normal Blood counts, kidney function, liver function, electrolytes, cholesterol.  Negative HIV screening  Bacigalupo, Dionne Bucy, MD, MPH [facility] 01/14/2017 9:08 AM

## 2017-01-14 NOTE — Telephone Encounter (Signed)
Left message advising pt. OK per DPR. 

## 2017-03-31 ENCOUNTER — Other Ambulatory Visit: Payer: Self-pay | Admitting: Family Medicine

## 2017-03-31 DIAGNOSIS — F3170 Bipolar disorder, currently in remission, most recent episode unspecified: Secondary | ICD-10-CM

## 2017-09-25 ENCOUNTER — Other Ambulatory Visit: Payer: Self-pay | Admitting: Family Medicine

## 2017-09-25 DIAGNOSIS — F3170 Bipolar disorder, currently in remission, most recent episode unspecified: Secondary | ICD-10-CM

## 2018-01-14 ENCOUNTER — Encounter: Payer: Self-pay | Admitting: Family Medicine

## 2018-01-14 ENCOUNTER — Ambulatory Visit: Payer: BC Managed Care – PPO | Admitting: Family Medicine

## 2018-01-14 VITALS — BP 122/72 | HR 95 | Temp 98.9°F | Ht 66.0 in | Wt 157.0 lb

## 2018-01-14 DIAGNOSIS — J069 Acute upper respiratory infection, unspecified: Secondary | ICD-10-CM | POA: Diagnosis not present

## 2018-01-14 DIAGNOSIS — Z23 Encounter for immunization: Secondary | ICD-10-CM | POA: Diagnosis not present

## 2018-01-14 NOTE — Progress Notes (Deleted)
Patient: Lisa Knox, Female    DOB: 1980-07-14, 37 y.o.   MRN: [id number] Visit Date: 01/14/2018  Today's Provider: [name], MD   Chief Complaint  Patient presents with  . Annual Exam   Subjective:    Annual physical exam Lisa Knox is a 37 y.o. female who presents today for health maintenance and complete physical. She feels {DESC; WELL/FAIRLY WELL/POORLY:18703}. She reports exercising ***. She reports she is sleeping {DESC; WELL/FAIRLY WELL/POORLY:18703}.  -----------------------------------------------------------------   Review of Systems  Constitutional: Negative.   HENT: Positive for rhinorrhea, sinus pressure and sore throat.   Eyes: Negative.   Respiratory: Positive for cough.   Cardiovascular: Negative.   Gastrointestinal: Negative.   Endocrine: Negative.   Genitourinary: Negative.   Musculoskeletal: Negative.   Skin: Negative.   Allergic/Immunologic: Positive for food allergies.  Neurological: Positive for headaches.  Hematological: Negative.   Psychiatric/Behavioral: Negative.     Social History      She  reports that she has never smoked. She has never used smokeless tobacco. She reports that she drinks alcohol. She reports that she does not use drugs.       Social History   Socioeconomic History  . Marital status: Single    Spouse name: Not on file  . Number of children: 0  . Years of education: 46  . Highest education level: Master's degree (e.g., MA, MS, MEng, MEd, MSW, MBA)  Occupational History    Employer: [employer]  Social Needs  . Financial resource strain: Not hard at all  . Food insecurity:    Worry: Never true    Inability: Never true  . Transportation needs:    Medical: No    Non-medical: No  Tobacco Use  . Smoking status: Never Smoker  . Smokeless tobacco: Never Used  Substance and Sexual Activity  . Alcohol use: Yes    Alcohol/week: 0.0 standard drinks    Comment: OCCASIONALLY  . Drug  use: No  . Sexual activity: Yes    Birth control/protection: None  Lifestyle  . Physical activity:    Days per week: 0 days    Minutes per session: 0 min  . Stress: Not on file  Relationships  . Social connections:    Talks on phone: Not on file    Gets together: Not on file    Attends religious service: Not on file    Active member of club or organization: Not on file    Attends meetings of clubs or organizations: Not on file    Relationship status: Not on file  Other Topics Concern  . Not on file  Social History Narrative  . Not on file    Past Medical History:  Diagnosis Date  . Anal fissure 2001  . Depression   . Headache    H/O     Patient Active Problem List   Diagnosis Date Noted  . Anal fissure 01/30/2015  . Affective bipolar disorder ([place]) 08/10/2014  . Clinical depression 08/10/2014  . Abnormal liver enzymes 08/10/2014  . Herpes zona 08/10/2014  . Headache, migraine 08/10/2014    Past Surgical History:  Procedure Laterality Date  . SIGMOIDOSCOPY  2001  . [place] EXTRACTION  2002    Family History        Family Status  Relation Name Status  . Mother  Alive  . Father  Alive  . Brother 1 Alive  . MGM  Deceased at age 22  .  MGF  Deceased  . PGM  Deceased at age 60  . PGF  Deceased at age 44  . Brother 2 Alive        Her family history includes Colon polyps in her father; Congestive Heart Failure in her maternal grandmother; Crohn's disease in her father; Dementia in her paternal grandfather; Diabetes in her paternal grandmother; GER disease in her mother; Gallbladder disease in her father; Hypertension in her brother and mother; Thyroid disease in her brother.      Allergies  Allergen Reactions  . Pollen Extract Other (See Comments)    Pet dander- Hives  . Shellfish Allergy Hives     Current Outpatient Medications:  .  fluticasone (FLONASE) 50 MCG/ACT nasal spray, Place into both nostrils daily., Disp: , Rfl:  .  lamoTRIgine  (LAMICTAL) 100 MG tablet, TAKE 2 TABLETS BY MOUTH ONCE DAILY AT BEDTIME, Disp: 60 tablet, Rfl: 2 .  Multiple Vitamin (MULTIVITAMIN) tablet, Take 1 tablet by mouth daily., Disp: , Rfl:    Patient Care Team: Virginia Crews, MD as PCP - General (Family Medicine) Christene Lye, MD (General Surgery)      Objective:   Vitals: There were no vitals taken for this visit.  There were no vitals filed for this visit.   Physical Exam   Depression Screen PHQ 2/9 Scores 01/14/2018 01/13/2017 01/30/2015  PHQ - 2 Score 0 0 0  PHQ- 9 Score 0 - -      Assessment & Plan:     Routine Health Maintenance and Physical Exam  Exercise Activities and Dietary recommendations Goals   None     Immunization History  Administered Date(s) Administered  . Influenza,inj,Quad PF,6+ Mos 01/30/2015, 01/13/2017  . Tdap 10/28/2011    Health Maintenance  Topic Date Due  . INFLUENZA VACCINE  09/11/2017  . PAP SMEAR  01/29/2018  . TETANUS/TDAP  10/27/2021  . HIV Screening  Completed     Discussed health benefits of physical activity, and encouraged her to engage in regular exercise appropriate for her age and condition.    --------------------------------------------------------------------    [name], MD  [place]

## 2018-01-14 NOTE — Progress Notes (Signed)
Patient: Lisa Knox Female    DOB: May 28, 1980   37 y.o.   MRN: [id number] Visit Date: 01/14/2018  Today's Provider: [name], MD   Chief Complaint  Patient presents with  . URI   Subjective:    URI   This is a new problem. Episode onset: Thursday. The problem has been gradually worsening. There has been no fever. Associated symptoms include congestion, coughing, headaches, rhinorrhea, sinus pain, sneezing and a sore throat. Pertinent negatives include no ear pain or wheezing. Treatments tried: Benadryl and Mucinex. The treatment provided mild relief.      Allergies  Allergen Reactions  . Pollen Extract Other (See Comments)    Pet dander- Hives  . Shellfish Allergy Hives     Current Outpatient Medications:  .  fluticasone (FLONASE) 50 MCG/ACT nasal spray, Place into both nostrils daily., Disp: , Rfl:  .  lamoTRIgine (LAMICTAL) 100 MG tablet, TAKE 2 TABLETS BY MOUTH ONCE DAILY AT BEDTIME, Disp: 60 tablet, Rfl: 2 .  Multiple Vitamin (MULTIVITAMIN) tablet, Take 1 tablet by mouth daily., Disp: , Rfl:   Review of Systems  Constitutional: Negative.   HENT: Positive for congestion, postnasal drip, rhinorrhea, sinus pressure, sinus pain, sneezing and sore throat. Negative for dental problem, ear pain, facial swelling, hearing loss, nosebleeds, trouble swallowing and voice change.   Eyes: Negative.   Respiratory: Positive for cough. Negative for apnea, choking, chest tightness, shortness of breath, wheezing and stridor.   Cardiovascular: Negative.   Gastrointestinal: Negative.   Genitourinary: Negative.   Musculoskeletal: Negative.   Neurological: Positive for headaches. Negative for dizziness, tremors, seizures, facial asymmetry, speech difficulty, weakness and light-headedness.  Psychiatric/Behavioral: Negative.     Social History   Tobacco Use  . Smoking status: Never Smoker  . Smokeless tobacco: Never Used  Substance Use Topics  . Alcohol use: Yes   Alcohol/week: 0.0 standard drinks    Comment: OCCASIONALLY   Objective:   BP 122/72 (BP Location: Right Arm, Patient Position: Sitting, Cuff Size: Normal)   Pulse 95   Temp 98.9 F (37.2 C) (Oral)   Ht 5\' 6"  (1.676 m)   Wt 157 lb (71.2 kg)   SpO2 97%   BMI 25.34 kg/m  Vitals:   01/14/18 0904  BP: 122/72  Pulse: 95  Temp: 98.9 F (37.2 C)  TempSrc: Oral  SpO2: 97%  Weight: 157 lb (71.2 kg)  Height: 5\' 6"  (1.676 m)     Physical Exam  Constitutional: She is oriented to person, place, and time. She appears well-developed and well-nourished. No distress.  HENT:  Head: Normocephalic and atraumatic.  Right Ear: Tympanic membrane, external ear and ear canal normal.  Left Ear: Tympanic membrane, external ear and ear canal normal.  Nose: Mucosal edema present. Right sinus exhibits maxillary sinus tenderness and frontal sinus tenderness. Left sinus exhibits maxillary sinus tenderness and frontal sinus tenderness.  Mouth/Throat: Uvula is midline, oropharynx is clear and moist and mucous membranes are normal. No oropharyngeal exudate, posterior oropharyngeal edema or posterior oropharyngeal erythema.  Eyes: Pupils are equal, round, and reactive to light. Conjunctivae are normal. Right eye exhibits no discharge. Left eye exhibits no discharge. No scleral icterus.  Neck: Neck supple. No thyromegaly present.  Cardiovascular: Normal rate, regular rhythm, normal heart sounds and intact distal pulses.  No murmur heard. Pulmonary/Chest: Effort normal and breath sounds normal. No respiratory distress. She has no wheezes. She has no rales.  Musculoskeletal: She exhibits no edema.  Lymphadenopathy:  She has no cervical adenopathy.  Neurological: She is alert and oriented to person, place, and time.  Skin: Skin is warm and dry. Capillary refill takes less than 2 seconds. No rash noted.  Psychiatric: She has a normal mood and affect. Her behavior is normal.  Vitals reviewed.         Assessment & Plan:   1. Viral URI - symptoms and exam c/w viral URI - no evidence of strep pharyngitis, CAP, AOM, bacterial sinusitis, or other bacterial infection - discussed symptomatic management, natural course, and return precautions  - given the sinus tenderness and pressure x6 days, after 2-3 days of treatment with Mucinex, decongestant, and nasal spray, could consider treatment for sinusitis given time course - she will call back if symptoms persist  2. Need for influenza vaccination - Flu Vaccine QUAD 6+ mos PF IM (Fluarix Quad PF)   Return if symptoms worsen or fail to improve.   The entirety of the information documented in the History of Present Illness, Review of Systems and Physical Exam were personally obtained by me. Portions of this information were initially documented by Tiburcio Pea, CMA and reviewed by me for thoroughness and accuracy.    Virginia Crews, MD, MPH [facility] 01/14/2018 9:41 AM

## 2018-01-14 NOTE — Patient Instructions (Signed)
Upper Respiratory Infection, Adult Most upper respiratory infections (URIs) are caused by a virus. A URI affects the nose, throat, and upper air passages. The most common type of URI is often called "the common cold." Follow these instructions at home:  Take medicines only as told by your doctor.  Gargle warm saltwater or take cough drops to comfort your throat as told by your doctor.  Use a warm mist humidifier or inhale steam from a shower to increase air moisture. This may make it easier to breathe.  Drink enough fluid to keep your pee (urine) clear or pale yellow.  Eat soups and other clear broths.  Have a healthy diet.  Rest as needed.  Go back to work when your fever is gone or your doctor says it is okay. ? You may need to stay home longer to avoid giving your URI to others. ? You can also wear a face mask and wash your hands often to prevent spread of the virus.  Use your inhaler more if you have asthma.  Do not use any tobacco products, including cigarettes, chewing tobacco, or electronic cigarettes. If you need help quitting, ask your doctor. Contact a doctor if:  You are getting worse, not better.  Your symptoms are not helped by medicine.  You have chills.  You are getting more short of breath.  You have brown or red mucus.  You have yellow or brown discharge from your nose.  You have pain in your face, especially when you bend forward.  You have a fever.  You have puffy (swollen) neck glands.  You have pain while swallowing.  You have white areas in the back of your throat. Get help right away if:  You have very bad or constant: ? Headache. ? Ear pain. ? Pain in your forehead, behind your eyes, and over your cheekbones (sinus pain). ? Chest pain.  You have long-lasting (chronic) lung disease and any of the following: ? Wheezing. ? Long-lasting cough. ? Coughing up blood. ? A change in your usual mucus.  You have a stiff neck.  You have  changes in your: ? Vision. ? Hearing. ? Thinking. ? Mood. This information is not intended to replace advice given to you by your health care provider. Make sure you discuss any questions you have with your health care provider. Document Released: 07/17/2007 Document Revised: 10/01/2015 Document Reviewed: 05/05/2013 Elsevier Interactive Patient Education  2018 Elsevier Inc.  

## 2018-03-03 ENCOUNTER — Encounter: Payer: BC Managed Care – PPO | Admitting: Family Medicine

## 2018-03-03 ENCOUNTER — Other Ambulatory Visit: Payer: Self-pay | Admitting: Family Medicine

## 2018-03-03 DIAGNOSIS — F3170 Bipolar disorder, currently in remission, most recent episode unspecified: Secondary | ICD-10-CM

## 2018-03-03 NOTE — Progress Notes (Deleted)
Patient: Lisa Knox, Female    DOB: 1981/02/02, 38 y.o.   MRN: [id number] Visit Date: 03/03/2018  Today's Provider: [name], MD   No chief complaint on file.  Subjective:    Annual physical exam Lisa Knox is a 38 y.o. female who presents today for health maintenance and complete physical. She feels {DESC; WELL/FAIRLY WELL/POORLY:18703}. She reports exercising ***. She reports she is sleeping {DESC; WELL/FAIRLY WELL/POORLY:18703}.  -----------------------------------------------------------------   Review of Systems  Social History      She  reports that she has never smoked. She has never used smokeless tobacco. She reports current alcohol use. She reports that she does not use drugs.       Social History   Socioeconomic History  . Marital status: Single    Spouse name: Not on file  . Number of children: 0  . Years of education: 42  . Highest education level: Master's degree (e.g., MA, MS, MEng, MEd, MSW, MBA)  Occupational History    Employer: [employer]  Social Needs  . Financial resource strain: Not hard at all  . Food insecurity:    Worry: Never true    Inability: Never true  . Transportation needs:    Medical: No    Non-medical: No  Tobacco Use  . Smoking status: Never Smoker  . Smokeless tobacco: Never Used  Substance and Sexual Activity  . Alcohol use: Yes    Alcohol/week: 0.0 standard drinks    Comment: OCCASIONALLY  . Drug use: No  . Sexual activity: Yes    Birth control/protection: None  Lifestyle  . Physical activity:    Days per week: 0 days    Minutes per session: 0 min  . Stress: Not on file  Relationships  . Social connections:    Talks on phone: Not on file    Gets together: Not on file    Attends religious service: Not on file    Active member of club or organization: Not on file    Attends meetings of clubs or organizations: Not on file    Relationship status: Not on file  Other Topics Concern  .  Not on file  Social History Narrative  . Not on file    Past Medical History:  Diagnosis Date  . Anal fissure 2001  . Depression   . Headache    H/O     Patient Active Problem List   Diagnosis Date Noted  . Anal fissure 01/30/2015  . Affective bipolar disorder ([place]) 08/10/2014  . Clinical depression 08/10/2014  . Abnormal liver enzymes 08/10/2014  . Herpes zona 08/10/2014  . Headache, migraine 08/10/2014    Past Surgical History:  Procedure Laterality Date  . SIGMOIDOSCOPY  2001  . [place] EXTRACTION  2002    Family History        Family Status  Relation Name Status  . Mother  Alive  . Father  Alive  . Brother 1 Alive  . MGM  Deceased at age 74  . MGF  Deceased  . PGM  Deceased at age 46  . PGF  Deceased at age 76  . Brother 2 Alive        Her family history includes Colon polyps in her father; Congestive Heart Failure in her maternal grandmother; Crohn's disease in her father; Dementia in her paternal grandfather; Diabetes in her paternal grandmother; GER disease in her mother; Gallbladder disease in her father; Hypertension in her  brother and mother; Thyroid disease in her brother.      Allergies  Allergen Reactions  . Pollen Extract Other (See Comments)    Pet dander- Hives  . Shellfish Allergy Hives     Current Outpatient Medications:  .  fluticasone (FLONASE) 50 MCG/ACT nasal spray, Place into both nostrils daily., Disp: , Rfl:  .  lamoTRIgine (LAMICTAL) 100 MG tablet, TAKE 2 TABLETS BY MOUTH ONCE DAILY AT BEDTIME, Disp: 60 tablet, Rfl: 2 .  Multiple Vitamin (MULTIVITAMIN) tablet, Take 1 tablet by mouth daily., Disp: , Rfl:    Patient Care Team: Virginia Crews, MD as PCP - General (Family Medicine) Christene Lye, MD (General Surgery)      Objective:   Vitals: There were no vitals taken for this visit.  There were no vitals filed for this visit.   Physical Exam   Depression Screen PHQ 2/9 Scores 01/14/2018 01/13/2017  01/30/2015  PHQ - 2 Score 0 0 0  PHQ- 9 Score 0 - -      Assessment & Plan:     Routine Health Maintenance and Physical Exam  Exercise Activities and Dietary recommendations Goals   None     Immunization History  Administered Date(s) Administered  . Influenza,inj,Quad PF,6+ Mos 01/30/2015, 01/13/2017, 01/14/2018  . Tdap 10/28/2011    Health Maintenance  Topic Date Due  . PAP SMEAR-Modifier  01/29/2018  . TETANUS/TDAP  10/27/2021  . INFLUENZA VACCINE  Completed  . HIV Screening  Completed     Discussed health benefits of physical activity, and encouraged her to engage in regular exercise appropriate for her age and condition.    --------------------------------------------------------------------    [name], MD  [place] .

## 2018-05-18 ENCOUNTER — Other Ambulatory Visit: Payer: Self-pay | Admitting: Family Medicine

## 2018-05-18 DIAGNOSIS — F3170 Bipolar disorder, currently in remission, most recent episode unspecified: Secondary | ICD-10-CM

## 2018-06-05 ENCOUNTER — Encounter: Payer: BC Managed Care – PPO | Admitting: Family Medicine

## 2018-07-17 ENCOUNTER — Other Ambulatory Visit: Payer: Self-pay | Admitting: Family Medicine

## 2018-07-17 DIAGNOSIS — F3170 Bipolar disorder, currently in remission, most recent episode unspecified: Secondary | ICD-10-CM

## 2018-07-17 NOTE — Telephone Encounter (Signed)
[name] was 01/14/2018 and upcoming appointment on 10/15/2018, please advise.

## 2018-09-29 ENCOUNTER — Other Ambulatory Visit: Payer: Self-pay | Admitting: Family Medicine

## 2018-09-29 DIAGNOSIS — F3170 Bipolar disorder, currently in remission, most recent episode unspecified: Secondary | ICD-10-CM

## 2018-10-15 ENCOUNTER — Other Ambulatory Visit: Payer: Self-pay

## 2018-10-15 ENCOUNTER — Encounter: Payer: Self-pay | Admitting: Family Medicine

## 2018-10-15 ENCOUNTER — Other Ambulatory Visit (HOSPITAL_COMMUNITY)
Admission: RE | Admit: 2018-10-15 | Discharge: 2018-10-15 | Disposition: A | Discharge status: Home / Self Care | Payer: Self-pay | Source: Ambulatory Visit | Attending: Family Medicine | Admitting: Family Medicine

## 2018-10-15 ENCOUNTER — Ambulatory Visit (INDEPENDENT_AMBULATORY_CARE_PROVIDER_SITE_OTHER): Payer: BC Managed Care – PPO | Admitting: Family Medicine

## 2018-10-15 VITALS — BP 128/75 | HR 93 | Temp 97.3°F | Ht 66.0 in | Wt 158.8 lb

## 2018-10-15 DIAGNOSIS — Z23 Encounter for immunization: Secondary | ICD-10-CM | POA: Diagnosis not present

## 2018-10-15 DIAGNOSIS — Z0000 Encounter for general adult medical examination without abnormal findings: Secondary | ICD-10-CM

## 2018-10-15 DIAGNOSIS — Z124 Encounter for screening for malignant neoplasm of cervix: Secondary | ICD-10-CM | POA: Diagnosis not present

## 2018-10-15 DIAGNOSIS — Z113 Encounter for screening for infections with a predominantly sexual mode of transmission: Secondary | ICD-10-CM

## 2018-10-15 DIAGNOSIS — F3170 Bipolar disorder, currently in remission, most recent episode unspecified: Secondary | ICD-10-CM

## 2018-10-15 DIAGNOSIS — Z1239 Encounter for other screening for malignant neoplasm of breast: Secondary | ICD-10-CM | POA: Diagnosis not present

## 2018-10-15 DIAGNOSIS — E663 Overweight: Secondary | ICD-10-CM

## 2018-10-15 NOTE — Patient Instructions (Signed)

## 2018-10-15 NOTE — Assessment & Plan Note (Signed)
Followed by Psych No changes to medications 

## 2018-10-15 NOTE — Progress Notes (Signed)
Patient: Lisa Knox, Female    DOB: [birth year], 37 y.o.   MRN: [id number] Visit Date: 10/15/2018  Today's Provider: [name], MD   Chief Complaint  Patient presents with  . Annual Exam   Subjective:    Annual physical exam Lisa Knox is a 38 y.o. female who presents today for health maintenance and complete physical. She feels well. She reports exercising sometimes. She reports she is sleeping well.  ----------------------------------------------------------------- Last Pap:01/30/2015 - NIL, HPV neg Last Mammogram: has never had one   Review of Systems  Constitutional: Negative.   HENT: Negative.   Eyes: Negative.   Respiratory: Negative.   Cardiovascular: Negative.   Gastrointestinal: Positive for blood in stool.  Endocrine: Negative.   Genitourinary: Negative.   Musculoskeletal: Negative.   Skin: Negative.   Allergic/Immunologic: Positive for food allergies.  Neurological: Negative.   Hematological: Negative.   Psychiatric/Behavioral: Negative.     Social History She  reports that she has never smoked. She has never used smokeless tobacco. She reports current alcohol use. She reports that she does not use drugs. Social History   Socioeconomic History  . Marital status: Single    Spouse name: Not on file  . Number of children: 0  . Years of education: 62  . Highest education level: Master's degree (e.g., MA, MS, MEng, MEd, MSW, MBA)  Occupational History    Employer: [employer]  Social Needs  . Financial resource strain: Not hard at all  . Food insecurity    Worry: Never true    Inability: Never true  . Transportation needs    Medical: No    Non-medical: No  Tobacco Use  . Smoking status: Never Smoker  . Smokeless tobacco: Never Used  Substance and Sexual Activity  . Alcohol use: Yes    Alcohol/week: 0.0 standard drinks    Comment: OCCASIONALLY  . Drug use: No  . Sexual activity: Yes    Birth control/protection:  None  Lifestyle  . Physical activity    Days per week: 0 days    Minutes per session: 0 min  . Stress: Not on file  Relationships  . Social Herbalist on phone: Not on file    Gets together: Not on file    Attends religious service: Not on file    Active member of club or organization: Not on file    Attends meetings of clubs or organizations: Not on file    Relationship status: Not on file  Other Topics Concern  . Not on file  Social History Narrative  . Not on file    Patient Active Problem List   Diagnosis Date Noted  . Affective bipolar disorder ([place]) 08/10/2014  . Clinical depression 08/10/2014  . Abnormal liver enzymes 08/10/2014  . Herpes zona 08/10/2014  . Headache, migraine 08/10/2014    Past Surgical History:  Procedure Laterality Date  . SIGMOIDOSCOPY  2001  . [place] EXTRACTION  2002    Family History  Family Status  Relation Name Status  . Mother  Alive  . Father  Alive  . Brother 1 Alive  . MGM  Deceased at age 74  . MGF  Deceased  . PGM  Deceased at age 62  . PGF  Deceased at age 65  . Brother 2 Alive  . Mat Aunt  Alive   Her family history includes Breast cancer in her maternal aunt; Colon polyps in her  father; Congestive Heart Failure in her maternal grandmother; Crohn's disease in her father; Dementia in her paternal grandfather; Diabetes in her paternal grandmother; GER disease in her mother; Gallbladder disease in her father; Hypertension in her brother and mother; Thyroid disease in her brother.     Allergies  Allergen Reactions  . Pollen Extract Other (See Comments)    Pet dander- Hives  . Shellfish Allergy Hives    Previous Medications   FLUTICASONE (FLONASE) 50 MCG/ACT NASAL SPRAY    Place into both nostrils daily.   LAMOTRIGINE (LAMICTAL) 100 MG TABLET    TAKE 2 TABLETS BY MOUTH ONCE DAILY AT BEDTIME   MULTIPLE VITAMIN (MULTIVITAMIN) TABLET    Take 1 tablet by mouth daily.    Patient Care Team: Virginia Crews, MD as PCP - General (Family Medicine) Christene Lye, MD (General Surgery)      Objective:   Vitals: BP 128/75   Pulse 93   Temp (!) 97.3 F (36.3 C) (Oral)   Ht 5\' 6"  (1.676 m)   Wt 158 lb 12.8 oz (72 kg)   SpO2 99%   BMI 25.63 kg/m    Physical Exam Vitals signs reviewed.  Constitutional:      General: She is not in acute distress.    Appearance: Normal appearance. She is well-developed. She is not diaphoretic.  HENT:     Head: Normocephalic and atraumatic.     Right Ear: Tympanic membrane, ear canal and external ear normal.     Left Ear: Tympanic membrane, ear canal and external ear normal.  Eyes:     General: No scleral icterus.    Conjunctiva/sclera: Conjunctivae normal.     Pupils: Pupils are equal, round, and reactive to light.  Neck:     Musculoskeletal: Neck supple.     Thyroid: No thyromegaly.  Cardiovascular:     Rate and Rhythm: Normal rate and regular rhythm.     Pulses: Normal pulses.     Heart sounds: Normal heart sounds. No murmur.  Pulmonary:     Effort: Pulmonary effort is normal. No respiratory distress.     Breath sounds: Normal breath sounds. No wheezing or rales.  Abdominal:     General: There is no distension.     Palpations: Abdomen is soft.     Tenderness: There is no abdominal tenderness. There is no guarding or rebound.  Genitourinary:    Comments: GYN:  External genitalia within normal limits.  Vaginal mucosa pink, moist, normal rugae.  Nonfriable cervix without lesions, no discharge or bleeding noted on speculum exam.  Bimanual exam revealed normal, nongravid uterus.  No cervical motion tenderness. No adnexal masses bilaterally.    Breasts: breasts appear normal, no suspicious masses, no skin or nipple changes or axillary nodes.  Musculoskeletal:        General: No deformity.     Right lower leg: No edema.     Left lower leg: No edema.  Lymphadenopathy:     Cervical: No cervical adenopathy.  Skin:    General: Skin is warm and  dry.     Capillary Refill: Capillary refill takes less than 2 seconds.     Findings: No rash.  Neurological:     Mental Status: She is alert and oriented to person, place, and time. Mental status is at baseline.  Psychiatric:        Mood and Affect: Mood normal.        Behavior: Behavior normal.  Thought Content: Thought content normal.      Depression Screen PHQ 2/9 Scores 10/15/2018 01/14/2018 01/13/2017 01/30/2015  PHQ - 2 Score 0 0 0 0  PHQ- 9 Score 0 0 - -      Assessment & Plan:     Routine Health Maintenance and Physical Exam  Exercise Activities and Dietary recommendations Goals   None     Immunization History  Administered Date(s) Administered  . Influenza,inj,Quad PF,6+ Mos 01/30/2015, 01/13/2017, 01/14/2018, 10/15/2018  . Tdap 10/28/2011    Health Maintenance  Topic Date Due  . PAP SMEAR-Modifier  01/29/2018  . TETANUS/TDAP  10/27/2021  . INFLUENZA VACCINE  Completed  . HIV Screening  Completed     Discussed health benefits of physical activity, and encouraged her to engage in regular exercise appropriate for her age and condition.    --------------------------------------------------------------------  Problem List Items Addressed This Visit      Other   Affective bipolar disorder ([place])    Followed by Psych No changes to medications       Other Visit Diagnoses    Encounter for annual physical exam    -  Primary   Relevant Orders   CBC with Differential/Platelet   Comprehensive metabolic panel   Lipid panel   Cervical cancer screening       Relevant Orders   Cytology - PAP   Need for influenza vaccination       Relevant Orders   Flu Vaccine QUAD 6+ mos PF IM (Fluarix Quad PF) (Completed)   Breast cancer screening       Relevant Orders   MM 3D SCREEN BREAST BILATERAL   Overweight       Relevant Orders   CBC with Differential/Platelet   Comprehensive metabolic panel   Lipid panel   Screen for STD (sexually transmitted disease)            Return in about 1 year (around 10/15/2019) for CPE.   The entirety of the information documented in the History of Present Illness, Review of Systems and Physical Exam were personally obtained by me. Portions of this information were initially documented by Tiburcio Pea, CMA and reviewed by me for thoroughness and accuracy.    [place], Dionne Bucy, MD MPH [facility]

## 2018-10-16 ENCOUNTER — Telehealth: Payer: Self-pay

## 2018-10-16 LAB — COMPREHENSIVE METABOLIC PANEL
ALT: 11 IU/L (ref 0–32)
AST: 14 IU/L (ref 0–40)
Albumin/Globulin Ratio: 1.3 (ref 1.2–2.2)
Albumin: 4.6 g/dL (ref 3.8–4.8)
Alkaline Phosphatase: 65 IU/L (ref 39–117)
BUN/Creatinine Ratio: 10 (ref 9–23)
BUN: 9 mg/dL (ref 6–24)
Bilirubin Total: 1 mg/dL (ref 0.0–1.2)
CO2: 22 mmol/L (ref 20–29)
Calcium: 9.2 mg/dL (ref 8.7–10.2)
Chloride: 101 mmol/L (ref 96–106)
Creatinine, Ser: 0.92 mg/dL (ref 0.57–1.00)
GFR calc Af Amer: 90 mL/min/{1.73_m2} (ref >59)
GFR calc non Af Amer: 78 mL/min/{1.73_m2} (ref >59)
Globulin, Total: 3.6 g/dL (ref 1.5–4.5)
Glucose: 87 mg/dL (ref 65–99)
Potassium: 3.6 mmol/L (ref 3.5–5.2)
Sodium: 138 mmol/L (ref 134–144)
Total Protein: 8.2 g/dL (ref 6.0–8.5)

## 2018-10-16 LAB — CBC WITH DIFFERENTIAL/PLATELET
Basophils Absolute: 0.1 10*3/uL (ref 0.0–0.2)
Basos: 1 %
EOS (ABSOLUTE): 0.4 10*3/uL (ref 0.0–0.4)
Eos: 6 %
Hematocrit: 42.4 % (ref 34.0–46.6)
Hemoglobin: 13.7 g/dL (ref 11.1–15.9)
Immature Grans (Abs): 0 10*3/uL (ref 0.0–0.1)
Immature Granulocytes: 0 %
Lymphocytes Absolute: 3.1 10*3/uL (ref 0.7–3.1)
Lymphs: 46 %
MCH: 30 pg (ref 26.6–33.0)
MCHC: 32.3 g/dL (ref 31.5–35.7)
MCV: 93 fL (ref 79–97)
Monocytes Absolute: 0.7 10*3/uL (ref 0.1–0.9)
Monocytes: 10 %
Neutrophils Absolute: 2.5 10*3/uL (ref 1.4–7.0)
Neutrophils: 37 %
Platelets: 287 10*3/uL (ref 150–450)
RBC: 4.56 x10E6/uL (ref 3.77–5.28)
RDW: 12.4 % (ref 11.7–15.4)
WBC: 6.7 10*3/uL (ref 3.4–10.8)

## 2018-10-16 LAB — LIPID PANEL
Chol/HDL Ratio: 2.3 ratio (ref 0.0–4.4)
Cholesterol, Total: 189 mg/dL (ref 100–199)
HDL: 81 mg/dL (ref >39)
LDL Chol Calc (NIH): 95 mg/dL (ref 0–99)
Triglycerides: 68 mg/dL (ref 0–149)
VLDL Cholesterol Cal: 13 mg/dL (ref 5–40)

## 2018-10-16 NOTE — Telephone Encounter (Signed)
Patient was notified of results. Expressed understanding.  

## 2018-10-16 NOTE — Telephone Encounter (Signed)
LVMTRC 

## 2018-10-16 NOTE — Telephone Encounter (Signed)
-----   Message from Virginia Crews, MD sent at 10/16/2018  8:14 AM EDT ----- Normal labs

## 2018-10-21 LAB — CYTOLOGY - PAP
Chlamydia: NEGATIVE
Diagnosis: NEGATIVE
HPV 16/18/45 genotyping: NEGATIVE
HPV: DETECTED — AB
Neisseria Gonorrhea: NEGATIVE

## 2018-10-22 ENCOUNTER — Telehealth: Payer: Self-pay

## 2018-10-22 NOTE — Telephone Encounter (Signed)
Patient was advised.  

## 2018-10-22 NOTE — Telephone Encounter (Signed)
-----   Message from Virginia Crews, MD sent at 10/21/2018  4:26 PM EDT ----- Normal cells on pap smear, but HPV positive.  As it is negative for high risk strains of HPV, will repeat pap smear in 1 year.

## 2018-11-28 ENCOUNTER — Other Ambulatory Visit: Payer: Self-pay | Admitting: Family Medicine

## 2018-11-28 DIAGNOSIS — F3170 Bipolar disorder, currently in remission, most recent episode unspecified: Secondary | ICD-10-CM

## 2019-01-04 ENCOUNTER — Other Ambulatory Visit: Payer: Self-pay

## 2019-01-04 DIAGNOSIS — Z20822 Contact with and (suspected) exposure to covid-19: Secondary | ICD-10-CM

## 2019-01-05 LAB — NOVEL CORONAVIRUS, NAA: SARS-CoV-2, NAA: NOT DETECTED

## 2019-03-27 ENCOUNTER — Other Ambulatory Visit: Payer: Self-pay | Admitting: Family Medicine

## 2019-03-27 DIAGNOSIS — F3170 Bipolar disorder, currently in remission, most recent episode unspecified: Secondary | ICD-10-CM

## 2019-03-27 NOTE — Telephone Encounter (Signed)
Requested medication (s) are due for refill today: yes  Requested medication (s) are on the active medication list: yes  Last refill:  11/30/19  Future visit scheduled: no  Notes to clinic:  medication not delegated to NT to refill   Requested Prescriptions  Pending Prescriptions Disp Refills   lamoTRIgine (LAMICTAL) 100 MG tablet [Pharmacy Med Name: lamoTRIgine 100 MG Oral Tablet] 60 tablet 0    Sig: TAKE 2 TABLETS BY MOUTH ONCE DAILY AT BEDTIME      Not Delegated - Neurology:  Anticonvulsants Failed - 03/27/2019  7:10 PM      Failed - This refill cannot be delegated      Passed - HCT in normal range and within 360 days    Hematocrit  Date Value Ref Range Status  10/15/2018 42.4 34.0 - 46.6 % Final          Passed - HGB in normal range and within 360 days    Hemoglobin  Date Value Ref Range Status  10/15/2018 13.7 11.1 - 15.9 g/dL Final          Passed - PLT in normal range and within 360 days    Platelets  Date Value Ref Range Status  10/15/2018 287 150 - 450 x10E3/uL Final          Passed - WBC in normal range and within 360 days    WBC  Date Value Ref Range Status  10/15/2018 6.7 3.4 - 10.8 x10E3/uL Final  01/13/2017 4.3 3.8 - 10.8 Thousand/uL Final          Passed - Valid encounter within last 12 months    Recent Outpatient Visits           5 months ago Encounter for annual physical exam   [facility], Dionne Bucy, MD   1 year ago Viral URI   [facility] [place], Dionne Bucy, MD   2 years ago Encounter for annual physical exam   [facility] [place], Dionne Bucy, MD   3 years ago Bipolar disorder in partial remission, most recent episode unspecified type [facility])   [place], Vickki Muff, [place]   4 years ago Annual physical exam   [facility] Margarita Rana, MD

## 2019-08-31 ENCOUNTER — Other Ambulatory Visit: Payer: Self-pay | Admitting: Internal Medicine

## 2019-08-31 DIAGNOSIS — Z1231 Encounter for screening mammogram for malignant neoplasm of breast: Secondary | ICD-10-CM

## 2019-09-02 ENCOUNTER — Ambulatory Visit
Admission: RE | Admit: 2019-09-02 | Discharge: 2019-09-02 | Disposition: A | Discharge status: Home / Self Care | Payer: BC Managed Care – PPO | Source: Ambulatory Visit

## 2019-09-02 ENCOUNTER — Other Ambulatory Visit: Payer: Self-pay

## 2019-09-02 DIAGNOSIS — Z1231 Encounter for screening mammogram for malignant neoplasm of breast: Secondary | ICD-10-CM

## 2019-09-06 ENCOUNTER — Other Ambulatory Visit: Payer: Self-pay | Admitting: Internal Medicine

## 2019-09-06 ENCOUNTER — Other Ambulatory Visit: Payer: Self-pay | Admitting: Family Medicine

## 2019-09-06 DIAGNOSIS — F3170 Bipolar disorder, currently in remission, most recent episode unspecified: Secondary | ICD-10-CM

## 2019-09-06 DIAGNOSIS — N6489 Other specified disorders of breast: Secondary | ICD-10-CM

## 2019-09-06 NOTE — Telephone Encounter (Signed)
Requested medications are due for refill today?   Yes - This medication refill cannot be delegated.    Requested medications are on active medication list?  Yes  Last Refill:   03/30/2019  # 60 with 3 refills.    Future visit scheduled?  Yes  Notes to Clinic:  This refill cannot be delegated.

## 2019-09-13 ENCOUNTER — Other Ambulatory Visit: Payer: Self-pay | Admitting: Primary Care

## 2019-09-13 DIAGNOSIS — N6489 Other specified disorders of breast: Secondary | ICD-10-CM

## 2019-09-14 ENCOUNTER — Other Ambulatory Visit: Payer: Self-pay | Admitting: Internal Medicine

## 2019-09-14 DIAGNOSIS — N6489 Other specified disorders of breast: Secondary | ICD-10-CM

## 2019-09-15 ENCOUNTER — Other Ambulatory Visit: Payer: Self-pay | Admitting: Family Medicine

## 2019-09-15 ENCOUNTER — Other Ambulatory Visit: Payer: Self-pay | Admitting: Primary Care

## 2019-09-15 DIAGNOSIS — N6489 Other specified disorders of breast: Secondary | ICD-10-CM

## 2019-09-16 ENCOUNTER — Telehealth: Payer: Self-pay

## 2019-09-16 NOTE — Telephone Encounter (Signed)
Copied from [place] [id number]. Topic: General - Other >> Sep 16, 2019  2:20 PM Sheran Luz wrote: Tish with the breast center calling to inquire if office received referral form that needs to be signed by physician and sent back. Please advise.

## 2019-09-17 ENCOUNTER — Other Ambulatory Visit: Payer: Self-pay | Admitting: Family Medicine

## 2019-09-17 ENCOUNTER — Ambulatory Visit
Admission: RE | Admit: 2019-09-17 | Discharge: 2019-09-17 | Disposition: A | Discharge status: Home / Self Care | Payer: BC Managed Care – PPO | Source: Ambulatory Visit | Attending: Internal Medicine | Admitting: Internal Medicine

## 2019-09-17 ENCOUNTER — Other Ambulatory Visit: Payer: Self-pay

## 2019-09-17 DIAGNOSIS — N6489 Other specified disorders of breast: Secondary | ICD-10-CM

## 2019-09-29 ENCOUNTER — Ambulatory Visit
Admission: RE | Admit: 2019-09-29 | Discharge: 2019-09-29 | Disposition: A | Discharge status: Home / Self Care | Payer: BC Managed Care – PPO | Source: Ambulatory Visit | Attending: Family Medicine | Admitting: Family Medicine

## 2019-09-29 ENCOUNTER — Other Ambulatory Visit: Payer: Self-pay

## 2019-09-29 DIAGNOSIS — N6489 Other specified disorders of breast: Secondary | ICD-10-CM

## 2019-10-15 ENCOUNTER — Other Ambulatory Visit: Payer: Self-pay | Admitting: Family Medicine

## 2019-10-15 DIAGNOSIS — F3170 Bipolar disorder, currently in remission, most recent episode unspecified: Secondary | ICD-10-CM

## 2019-10-15 NOTE — Telephone Encounter (Signed)
[facility] faxed refill request for the following medications:  lamoTRIgine (LAMICTAL) 100 MG tablet   Please advise.  Thanks, [employer]

## 2019-10-19 MED ORDER — LAMOTRIGINE 100 MG PO TABS: 100.0000 mg | ORAL_TABLET | Freq: Every day | ORAL | 1 refills | Status: DC

## 2019-10-19 NOTE — Addendum Note (Signed)
Addended by: Ashley Royalty E on: 10/19/2019 10:18 AM   Modules accepted: Orders

## 2019-10-20 ENCOUNTER — Encounter: Payer: BC Managed Care – PPO | Admitting: Family Medicine

## 2019-12-06 ENCOUNTER — Other Ambulatory Visit: Payer: Self-pay

## 2019-12-06 ENCOUNTER — Other Ambulatory Visit (HOSPITAL_COMMUNITY)
Admission: RE | Admit: 2019-12-06 | Discharge: 2019-12-06 | Disposition: A | Discharge status: Home / Self Care | Payer: BC Managed Care – PPO | Source: Ambulatory Visit | Attending: Family Medicine | Admitting: Family Medicine

## 2019-12-06 ENCOUNTER — Ambulatory Visit (INDEPENDENT_AMBULATORY_CARE_PROVIDER_SITE_OTHER): Payer: BC Managed Care – PPO | Admitting: Family Medicine

## 2019-12-06 ENCOUNTER — Encounter: Payer: Self-pay | Admitting: Family Medicine

## 2019-12-06 VITALS — BP 145/90 | HR 80 | Temp 99.1°F | Resp 16 | Ht 66.0 in | Wt 158.0 lb

## 2019-12-06 DIAGNOSIS — Z23 Encounter for immunization: Secondary | ICD-10-CM | POA: Diagnosis not present

## 2019-12-06 DIAGNOSIS — Z0000 Encounter for general adult medical examination without abnormal findings: Secondary | ICD-10-CM | POA: Insufficient documentation

## 2019-12-06 DIAGNOSIS — B977 Papillomavirus as the cause of diseases classified elsewhere: Secondary | ICD-10-CM | POA: Insufficient documentation

## 2019-12-06 DIAGNOSIS — Z124 Encounter for screening for malignant neoplasm of cervix: Secondary | ICD-10-CM | POA: Diagnosis present

## 2019-12-06 DIAGNOSIS — I10 Essential (primary) hypertension: Secondary | ICD-10-CM | POA: Insufficient documentation

## 2019-12-06 DIAGNOSIS — Z1159 Encounter for screening for other viral diseases: Secondary | ICD-10-CM | POA: Diagnosis not present

## 2019-12-06 DIAGNOSIS — R030 Elevated blood-pressure reading, without diagnosis of hypertension: Secondary | ICD-10-CM

## 2019-12-06 DIAGNOSIS — F3170 Bipolar disorder, currently in remission, most recent episode unspecified: Secondary | ICD-10-CM

## 2019-12-06 NOTE — Assessment & Plan Note (Signed)
Followed by Psych No changes to medications

## 2019-12-06 NOTE — Progress Notes (Signed)
Complete physical exam   Patient: Lisa Knox   DOB: [birth year]   39 y.o. Female  MRN: [id number] Visit Date: 12/06/2019  Today's healthcare provider: [name], MD   Chief Complaint  Patient presents with  . Annual Exam   Subjective    Lisa Knox is a 39 y.o. female who presents today for a complete physical exam.  She reports consuming a general diet. The patient does not participate in regular exercise at present. She generally feels well. She reports sleeping well. She does not have additional problems to discuss today.    Past Medical History:  Diagnosis Date  . Anal fissure 2001  . Depression   . Headache    H/O   Past Surgical History:  Procedure Laterality Date  . SIGMOIDOSCOPY  2001  . WISDOM TOOTH EXTRACTION  2002   Social History   Socioeconomic History  . Marital status: Single    Spouse name: Not on file  . Number of children: 0  . Years of education: 68  . Highest education level: Master's degree (e.g., MA, MS, MEng, MEd, MSW, MBA)  Occupational History    Employer: [employer]  Tobacco Use  . Smoking status: Never Smoker  . Smokeless tobacco: Never Used  Vaping Use  . Vaping Use: Never used  Substance and Sexual Activity  . Alcohol use: Yes    Alcohol/week: 0.0 standard drinks    Comment: OCCASIONALLY  . Drug use: No  . Sexual activity: Yes    Birth control/protection: None  Other Topics Concern  . Not on file  Social History Narrative  . Not on file   Social Determinants of Health   Financial Resource Strain:   . Difficulty of Paying Living Expenses: Not on file  Food Insecurity:   . Worried About Charity fundraiser in the Last Year: Not on file  . Ran Out of Food in the Last Year: Not on file  Transportation Needs:   . Lack of Transportation (Medical): Not on file  . Lack of Transportation (Non-Medical): Not on file  Physical Activity:   . Days of Exercise per Week: Not on file  . Minutes of  Exercise per Session: Not on file  Stress:   . Feeling of Stress : Not on file  Social Connections:   . Frequency of Communication with Friends and Family: Not on file  . Frequency of Social Gatherings with Friends and Family: Not on file  . Attends Religious Services: Not on file  . Active Member of Clubs or Organizations: Not on file  . Attends Archivist Meetings: Not on file  . Marital Status: Not on file  Intimate Partner Violence:   . Fear of Current or Ex-Partner: Not on file  . Emotionally Abused: Not on file  . Physically Abused: Not on file  . Sexually Abused: Not on file   Family Status  Relation Name Status  . Mother  Alive  . Father  Alive  . Brother 1 Alive  . MGM  Deceased at age 41  . MGF  Deceased  . PGM  Deceased at age 24  . PGF  Deceased at age 61  . Brother 2 Alive  . Mat Aunt  Alive   Family History  Problem Relation Age of Onset  . Hypertension Mother   . GER disease Mother   . Colon polyps Father   . Gallbladder disease Father   . Crohn's disease  Father   . Hypertension Brother   . Congestive Heart Failure Maternal Grandmother   . Diabetes Paternal Grandmother   . Dementia Paternal Grandfather   . Thyroid disease Brother   . Breast cancer Maternal Aunt    Allergies  Allergen Reactions  . Pollen Extract Other (See Comments)    Pet dander- Hives  . Shellfish Allergy Hives    Patient Care Team: Frona Yost, Dionne Bucy, MD as PCP - General (Family Medicine) Christene Lye, MD (General Surgery)   Medications: Outpatient Medications Prior to Visit  Medication Sig  . fluticasone (FLONASE) 50 MCG/ACT nasal spray Place into both nostrils daily.  Marland Kitchen lamoTRIgine (LAMICTAL) 100 MG tablet Take 1 tablet (100 mg total) by mouth at bedtime.  . Multiple Vitamin (MULTIVITAMIN) tablet Take 1 tablet by mouth daily.   No facility-administered medications prior to visit.    Review of Systems  Constitutional: Negative.   HENT: Negative.     Eyes: Negative.   Respiratory: Negative.   Cardiovascular: Negative.   Gastrointestinal: Negative.   Endocrine: Negative.   Genitourinary: Negative.   Musculoskeletal: Negative.   Skin: Negative.   Allergic/Immunologic: Negative.   Neurological: Negative.   Hematological: Negative.   Psychiatric/Behavioral: Negative.       Objective    BP (!) 145/90   Pulse 80   Temp 99.1 F (37.3 C)   Resp 16   Ht 5\' 6"  (1.676 m)   Wt 158 lb (71.7 kg)   LMP 11/18/2019   BMI 25.50 kg/m    Physical Exam Vitals reviewed.  Constitutional:      General: She is not in acute distress.    Appearance: Normal appearance. She is well-developed. She is not diaphoretic.  HENT:     Head: Normocephalic and atraumatic.     Right Ear: Tympanic membrane, ear canal and external ear normal.     Left Ear: Tympanic membrane, ear canal and external ear normal.  Eyes:     General: No scleral icterus.    Conjunctiva/sclera: Conjunctivae normal.     Pupils: Pupils are equal, round, and reactive to light.  Neck:     Thyroid: No thyromegaly.  Cardiovascular:     Rate and Rhythm: Normal rate and regular rhythm.     Pulses: Normal pulses.     Heart sounds: Normal heart sounds. No murmur heard.   Pulmonary:     Effort: Pulmonary effort is normal. No respiratory distress.     Breath sounds: Normal breath sounds. No wheezing or rales.  Abdominal:     General: Bowel sounds are normal. There is no distension.     Palpations: Abdomen is soft.     Tenderness: There is no abdominal tenderness.  Genitourinary:    Comments: GYN:  External genitalia within normal limits.  Vaginal mucosa pink, moist, normal rugae.  Nonfriable cervix without lesions, no discharge or bleeding noted on speculum exam.  Bimanual exam revealed normal, nongravid uterus.  No cervical motion tenderness. No adnexal masses bilaterally.    Musculoskeletal:        General: No deformity. Normal range of motion.     Cervical back: Neck supple.      Right lower leg: No edema.     Left lower leg: No edema.  Lymphadenopathy:     Cervical: No cervical adenopathy.  Skin:    General: Skin is warm and dry.     Findings: No rash.  Neurological:     Mental Status: She is alert and oriented  to person, place, and time. Mental status is at baseline.     Sensory: No sensory deficit.     Motor: No weakness.     Gait: Gait normal.  Psychiatric:        Mood and Affect: Mood normal.        Behavior: Behavior normal.        Thought Content: Thought content normal.      Last depression screening scores PHQ 2/9 Scores 12/06/2019 10/15/2018 01/14/2018  PHQ - 2 Score 0 0 0  PHQ- 9 Score - 0 0   Last fall risk screening Fall Risk  12/06/2019  Falls in the past year? 0  Number falls in past yr: 0  Injury with Fall? 0  Risk for fall due to : No Fall Risks  Follow up Falls evaluation completed   Last Audit-C alcohol use screening Alcohol Use Disorder Test (AUDIT) 12/06/2019  1. How often do you have a drink containing alcohol? 2  2. How many drinks containing alcohol do you have on a typical day when you are drinking? 0  3. How often do you have six or more drinks on one occasion? 0  AUDIT-C Score 2  Alcohol Brief Interventions/Follow-up AUDIT Score <7 follow-up not indicated   A score of 3 or more in women, and 4 or more in men indicates increased risk for alcohol abuse, EXCEPT if all of the points are from question 1   No results found for any visits on 12/06/19.  Assessment & Plan    Routine Health Maintenance and Physical Exam  Exercise Activities and Dietary recommendations Goals   None     Immunization History  Administered Date(s) Administered  . Influenza,inj,Quad PF,6+ Mos 01/30/2015, 01/13/2017, 01/14/2018, 10/15/2018, 12/06/2019  . PFIZER SARS-COV-2 Vaccination 04/15/2019, 05/06/2019  . Tdap 10/28/2011    Health Maintenance  Topic Date Due  . Hepatitis C Screening  Never done  . PAP SMEAR-Modifier  10/14/2021   . TETANUS/TDAP  10/27/2021  . INFLUENZA VACCINE  Completed  . COVID-19 Vaccine  Completed  . HIV Screening  Completed    Discussed health benefits of physical activity, and encouraged her to engage in regular exercise appropriate for her age and condition.   Problem List Items Addressed This Visit      Other   Affective bipolar disorder ([place])    Followed by Psych No changes to medications      Elevated BP without diagnosis of hypertension    Elevated today but typically well controlled  3 months of lifestyle interventions Recheck in 3 months prior to considering antihypertensive       Other Visit Diagnoses    Annual physical exam    -  Primary   Relevant Orders   Lipid panel   Comprehensive metabolic panel   CBC w/Diff/Platelet   Hepatitis C Antibody   Cytology - PAP   Need for influenza vaccination       Relevant Orders   Flu Vaccine QUAD 6+ mos PF IM (Fluarix Quad PF) (Completed)   Need for hepatitis C screening test       Relevant Orders   Hepatitis C Antibody   Screening for cervical cancer       Relevant Orders   Cytology - PAP   HPV (human papilloma virus) infection       Relevant Orders   Cytology - PAP        Return in about 1 year (around 12/05/2020) for CPE.  I, [name], MD, have reviewed all documentation for this visit. The documentation on 12/06/19 for the exam, diagnosis, procedures, and orders are all accurate and complete.   Kerem Gilmer, Dionne Bucy, MD, MPH [facility]

## 2019-12-06 NOTE — Patient Instructions (Signed)

## 2019-12-06 NOTE — Assessment & Plan Note (Signed)
Elevated today but typically well controlled  3 months of lifestyle interventions Recheck in 3 months prior to considering antihypertensive

## 2019-12-07 ENCOUNTER — Telehealth: Payer: Self-pay

## 2019-12-07 LAB — CBC WITH DIFFERENTIAL/PLATELET
Basophils Absolute: 0.1 10*3/uL (ref 0.0–0.2)
Basos: 1 %
EOS (ABSOLUTE): 0.5 10*3/uL — ABNORMAL HIGH (ref 0.0–0.4)
Eos: 9 %
Hematocrit: 38.9 % (ref 34.0–46.6)
Hemoglobin: 13.7 g/dL (ref 11.1–15.9)
Immature Grans (Abs): 0 10*3/uL (ref 0.0–0.1)
Immature Granulocytes: 0 %
Lymphocytes Absolute: 2.3 10*3/uL (ref 0.7–3.1)
Lymphs: 42 %
MCH: 31.1 pg (ref 26.6–33.0)
MCHC: 35.2 g/dL (ref 31.5–35.7)
MCV: 88 fL (ref 79–97)
Monocytes Absolute: 0.5 10*3/uL (ref 0.1–0.9)
Monocytes: 8 %
Neutrophils Absolute: 2.2 10*3/uL (ref 1.4–7.0)
Neutrophils: 40 %
Platelets: 298 10*3/uL (ref 150–450)
RBC: 4.4 x10E6/uL (ref 3.77–5.28)
RDW: 11.9 % (ref 11.7–15.4)
WBC: 5.5 10*3/uL (ref 3.4–10.8)

## 2019-12-07 LAB — COMPREHENSIVE METABOLIC PANEL
ALT: 12 IU/L (ref 0–32)
AST: 11 IU/L (ref 0–40)
Albumin/Globulin Ratio: 1.1 — ABNORMAL LOW (ref 1.2–2.2)
Albumin: 4.4 g/dL (ref 3.8–4.8)
Alkaline Phosphatase: 68 IU/L (ref 44–121)
BUN/Creatinine Ratio: 12 (ref 9–23)
BUN: 12 mg/dL (ref 6–24)
Bilirubin Total: 0.7 mg/dL (ref 0.0–1.2)
CO2: 22 mmol/L (ref 20–29)
Calcium: 9.1 mg/dL (ref 8.7–10.2)
Chloride: 102 mmol/L (ref 96–106)
Creatinine, Ser: 0.99 mg/dL (ref 0.57–1.00)
GFR calc Af Amer: 82 mL/min/{1.73_m2} (ref >59)
GFR calc non Af Amer: 71 mL/min/{1.73_m2} (ref >59)
Globulin, Total: 4 g/dL (ref 1.5–4.5)
Glucose: 84 mg/dL (ref 65–99)
Potassium: 3.9 mmol/L (ref 3.5–5.2)
Sodium: 138 mmol/L (ref 134–144)
Total Protein: 8.4 g/dL (ref 6.0–8.5)

## 2019-12-07 LAB — LIPID PANEL
Chol/HDL Ratio: 2.8 ratio (ref 0.0–4.4)
Cholesterol, Total: 185 mg/dL (ref 100–199)
HDL: 66 mg/dL (ref >39)
LDL Chol Calc (NIH): 105 mg/dL — ABNORMAL HIGH (ref 0–99)
Triglycerides: 76 mg/dL (ref 0–149)
VLDL Cholesterol Cal: 14 mg/dL (ref 5–40)

## 2019-12-07 LAB — HEPATITIS C ANTIBODY: Hep C Virus Ab: <0.1 s/co ratio (ref 0.0–0.9)

## 2019-12-07 NOTE — Telephone Encounter (Signed)
Written by Virginia Crews, MD on 12/07/2019 9:39 AM EDT Seen by patient Denver Faster on 12/07/2019 10:42 AM

## 2019-12-07 NOTE — Telephone Encounter (Signed)
-----   Message from Virginia Crews, MD sent at 12/07/2019  9:39 AM EDT ----- Normal labs

## 2019-12-09 LAB — CYTOLOGY - PAP
Comment: NEGATIVE
High risk HPV: NEGATIVE

## 2020-03-06 ENCOUNTER — Ambulatory Visit: Payer: Self-pay | Admitting: Family Medicine

## 2020-03-16 ENCOUNTER — Ambulatory Visit: Payer: Self-pay | Admitting: Family Medicine

## 2020-04-03 ENCOUNTER — Ambulatory Visit: Payer: BC Managed Care – PPO | Admitting: Family Medicine

## 2020-04-03 ENCOUNTER — Encounter: Payer: Self-pay | Admitting: Family Medicine

## 2020-04-03 ENCOUNTER — Other Ambulatory Visit: Payer: Self-pay

## 2020-04-03 VITALS — BP 136/76 | HR 91 | Temp 98.3°F | Ht 66.0 in | Wt 158.0 lb

## 2020-04-03 DIAGNOSIS — I10 Essential (primary) hypertension: Secondary | ICD-10-CM

## 2020-04-03 NOTE — Patient Instructions (Signed)

## 2020-04-03 NOTE — Progress Notes (Signed)
Established patient visit   Patient: Lisa Knox   DOB: 1980/10/20   40 y.o. Female  MRN: [id number] Visit Date: 04/03/2020  Today's healthcare provider: [name], MD   Chief Complaint  Patient presents with  . Hypertension   Subjective    HPI  Hypertension, follow-up  BP Readings from Last 3 Encounters:  04/03/20 136/76  12/06/19 (!) 145/90  10/15/18 128/75   Wt Readings from Last 3 Encounters:  04/03/20 158 lb (71.7 kg)  12/06/19 158 lb (71.7 kg)  10/15/18 158 lb 12.8 oz (72 kg)     She was last seen for hypertension 4 months ago.  BP at that visit was 145/90. Management since that visit includes continue current medications.    She reports excellent compliance with treatment. She is not having side effects.  She is following a Regular diet. She is exercising. She does not smoke.  Use of agents associated with hypertension: none.   Outside blood pressures are 130s/90s.  Symptoms: No chest pain No chest pressure  No palpitations No syncope  No dyspnea No orthopnea  No paroxysmal nocturnal dyspnea No lower extremity edema   Pertinent labs: Lab Results  Component Value Date   CHOL 185 12/06/2019   HDL 66 12/06/2019   LDLCALC 105 (H) 12/06/2019   TRIG 76 12/06/2019   CHOLHDL 2.8 12/06/2019   Lab Results  Component Value Date   NA 138 12/06/2019   K 3.9 12/06/2019   CREATININE 0.99 12/06/2019   GFRNONAA 71 12/06/2019   GFRAA 82 12/06/2019   GLUCOSE 84 12/06/2019     The 10-year ASCVD risk score Mikey Bussing DC Jr., et al., 2013) is: 0.5%   ---------------------------------------------------------------------------------------------------   Family History  Problem Relation Age of Onset  . Hypertension Mother   . GER disease Mother   . Colon polyps Father   . Gallbladder disease Father   . Crohn's disease Father   . Hypertension Father   . Hypertension Brother   . Congestive Heart Failure Maternal Grandmother   . Diabetes Paternal  Grandmother   . Dementia Paternal Grandfather   . Thyroid disease Brother   . Breast cancer Maternal Aunt        Medications: Outpatient Medications Prior to Visit  Medication Sig  . fluticasone (FLONASE) 50 MCG/ACT nasal spray Place into both nostrils daily.  Marland Kitchen lamoTRIgine (LAMICTAL) 100 MG tablet Take 1 tablet (100 mg total) by mouth at bedtime.  . Multiple Vitamin (MULTIVITAMIN) tablet Take 1 tablet by mouth daily.   No facility-administered medications prior to visit.    Review of Systems - per HPI     Objective    BP 136/76 (BP Location: Left Arm, Patient Position: Sitting, Cuff Size: Normal)   Pulse 91   Temp 98.3 F (36.8 C) (Oral)   Ht 5\' 6"  (1.676 m)   Wt 158 lb (71.7 kg)   SpO2 100%   BMI 25.50 kg/m     Physical Exam Vitals reviewed.  Constitutional:      General: She is not in acute distress.    Appearance: Normal appearance. She is well-developed. She is not diaphoretic.  HENT:     Head: Normocephalic and atraumatic.  Eyes:     General: No scleral icterus.    Conjunctiva/sclera: Conjunctivae normal.  Neck:     Thyroid: No thyromegaly.  Cardiovascular:     Rate and Rhythm: Normal rate and regular rhythm.     Pulses: Normal pulses.  Heart sounds: Normal heart sounds. No murmur heard.   Pulmonary:     Effort: Pulmonary effort is normal. No respiratory distress.     Breath sounds: Normal breath sounds. No wheezing, rhonchi or rales.  Musculoskeletal:     Cervical back: Neck supple.     Right lower leg: No edema.     Left lower leg: No edema.  Lymphadenopathy:     Cervical: No cervical adenopathy.  Skin:    General: Skin is warm and dry.     Findings: No rash.  Neurological:     Mental Status: She is alert and oriented to person, place, and time. Mental status is at baseline.  Psychiatric:        Mood and Affect: Mood normal.        Behavior: Behavior normal.       No results found for any visits on 04/03/20.  Assessment & Plan      Problem List Items Addressed This Visit      Cardiovascular and Mediastinum   Hypertension - Primary    New diagnosis Blood pressure remains elevated in the 130s over 90s consistently at home She is initially much higher than that, but on manual recheck she is in the 884Z systolic here as well Discussed the importance of tight blood pressure control at her young age for long-term benefit She will try 3 months of lifestyle interventions-discussed regular exercise-diet Follow-up in 3 months, we will reassess the possibility of antihypertensives and likely recheck BMP          Return in about 3 months (around 07/01/2020) for BP f/u.      Total time spent on today's visit was greater than 30 minutes, including both face-to-face time and nonface-to-face time personally spent on review of chart (labs and imaging), discussing labs and goals, discussing further work-up, treatment options, referrals to specialist if needed, reviewing outside records of pertinent, answering patient's questions, and coordinating care.    I, [name], MD, have reviewed all documentation for this visit. The documentation on 04/03/20 for the exam, diagnosis, procedures, and orders are all accurate and complete.   Kasie Leccese, Dionne Bucy, MD, MPH [facility]

## 2020-04-03 NOTE — Assessment & Plan Note (Signed)
New diagnosis Blood pressure remains elevated in the 130s over 90s consistently at home She is initially much higher than that, but on manual recheck she is in the 798V systolic here as well Discussed the importance of tight blood pressure control at her young age for long-term benefit She will try 3 months of lifestyle interventions-discussed regular exercise-diet Follow-up in 3 months, we will reassess the possibility of antihypertensives and likely recheck BMP

## 2020-04-14 ENCOUNTER — Other Ambulatory Visit: Payer: Self-pay | Admitting: Family Medicine

## 2020-04-14 DIAGNOSIS — F3170 Bipolar disorder, currently in remission, most recent episode unspecified: Secondary | ICD-10-CM

## 2020-04-14 NOTE — Telephone Encounter (Signed)
Requested medications are due for refill today yes  Requested medications are on the active medication list yes  Last refill 12/13/19  Last visit 10/21  Future visit scheduled 5/22 and 10/22  Notes to clinic Not Delegated

## 2020-07-03 ENCOUNTER — Ambulatory Visit: Payer: Self-pay | Admitting: Family Medicine

## 2020-08-25 ENCOUNTER — Encounter: Payer: Self-pay | Admitting: Family Medicine

## 2020-08-25 ENCOUNTER — Other Ambulatory Visit: Payer: Self-pay

## 2020-08-25 ENCOUNTER — Ambulatory Visit: Payer: BC Managed Care – PPO | Admitting: Family Medicine

## 2020-08-25 ENCOUNTER — Other Ambulatory Visit: Payer: Self-pay | Admitting: Family Medicine

## 2020-08-25 VITALS — BP 130/87 | HR 89 | Temp 98.9°F | Resp 16 | Ht 66.0 in | Wt 158.0 lb

## 2020-08-25 DIAGNOSIS — I10 Essential (primary) hypertension: Secondary | ICD-10-CM

## 2020-08-25 DIAGNOSIS — F3170 Bipolar disorder, currently in remission, most recent episode unspecified: Secondary | ICD-10-CM | POA: Diagnosis not present

## 2020-08-25 MED ORDER — LAMOTRIGINE 100 MG PO TABS: 100.0000 mg | ORAL_TABLET | Freq: Every day | ORAL | 5 refills | Status: DC

## 2020-08-25 NOTE — Telephone Encounter (Signed)
   Notes to clinic:  this refill cannot be delegated Looks like may have been filled today    Requested Prescriptions  Pending Prescriptions Disp Refills   lamoTRIgine (LAMICTAL) 100 MG tablet [Pharmacy Med Name: lamoTRIgine 100 MG Oral Tablet] 60 tablet 0    Sig: TAKE 1 TABLET BY MOUTH AT BEDTIME      Not Delegated - Neurology:  Anticonvulsants Failed - 08/25/2020  9:37 AM      Failed - This refill cannot be delegated      Passed - HCT in normal range and within 360 days    Hematocrit  Date Value Ref Range Status  12/06/2019 38.9 34.0 - 46.6 % Final          Passed - HGB in normal range and within 360 days    Hemoglobin  Date Value Ref Range Status  12/06/2019 13.7 11.1 - 15.9 g/dL Final          Passed - PLT in normal range and within 360 days    Platelets  Date Value Ref Range Status  12/06/2019 298 150 - 450 x10E3/uL Final          Passed - WBC in normal range and within 360 days    WBC  Date Value Ref Range Status  12/06/2019 5.5 3.4 - 10.8 x10E3/uL Final  01/13/2017 4.3 3.8 - 10.8 Thousand/uL Final          Passed - Valid encounter within last 12 months    Recent Outpatient Visits           Today Primary hypertension   [facility], Dionne Bucy, MD   4 months ago Primary hypertension   [facility] [place], Dionne Bucy, MD   8 months ago Annual physical exam   [facility] [place], Dionne Bucy, MD   1 year ago Encounter for annual physical exam   [facility] [place], Dionne Bucy, MD   2 years ago Viral URI   [facility] [place], Dionne Bucy, MD       Future Appointments             In 3 months Bac[place]alupo, Dionne Bucy, MD [facility] [place]

## 2020-08-25 NOTE — Assessment & Plan Note (Signed)
Well controlled Continue lamictal

## 2020-08-25 NOTE — Assessment & Plan Note (Signed)
Well controleld with lifestyle changes No meds needed at this time

## 2020-08-25 NOTE — Progress Notes (Signed)
Established patient visit   Patient: Lisa Knox   DOB: [birth year]   40 y.o. Female  MRN: [id number] Visit Date: 08/25/2020  Today's healthcare provider: [name], MD   No chief complaint on file.  Subjective    Hypertension Pertinent negatives include no chest pain, headaches, neck pain, palpitations or shortness of breath.    Hypertension, follow-up  BP Readings from Last 3 Encounters:  04/03/20 136/76  12/06/19 (!) 145/90  10/15/18 128/75   Wt Readings from Last 3 Encounters:  04/03/20 158 lb (71.7 kg)  12/06/19 158 lb (71.7 kg)  10/15/18 158 lb 12.8 oz (72 kg)     She was last seen for hypertension 4 months ago.  BP at that visit was 136/76. Management since that visit includes advised to check BP at home, start healthy lifestyle changes.  She is following a Low Sodium diet. She is exercising. She does not smoke.  Use of agents associated with hypertension: none.   Outside blood pressures are normal  Pertinent labs: Lab Results  Component Value Date   CHOL 185 12/06/2019   HDL 66 12/06/2019   LDLCALC 105 (H) 12/06/2019   TRIG 76 12/06/2019   CHOLHDL 2.8 12/06/2019   Lab Results  Component Value Date   NA 138 12/06/2019   K 3.9 12/06/2019   CREATININE 0.99 12/06/2019   GFRNONAA 71 12/06/2019   GFRAA 82 12/06/2019   GLUCOSE 84 12/06/2019     The 10-year ASCVD risk score Mikey Bussing DC Jr., et al., 2013) is: 0.6%   Bipolar Disorder  She is complaint with medication regime 100 mg lamictal for her bipolar disorder. She is requesting for a refill.   ----------------------------------------------------------------------------  Patient Active Problem List   Diagnosis Date Noted   Hypertension 12/06/2019   Affective bipolar disorder ([place]) 08/10/2014   Clinical depression 08/10/2014   Abnormal liver enzymes 08/10/2014   Herpes zona 08/10/2014   Headache, migraine 08/10/2014   Social History   Tobacco Use   Smoking status: Never   Smokeless  tobacco: Never  Vaping Use   Vaping Use: Never used  Substance Use Topics   Alcohol use: Yes    Alcohol/week: 0.0 standard drinks    Comment: OCCASIONALLY   Drug use: No   Allergies  Allergen Reactions   Pollen Extract Other (See Comments)    Pet dander- Hives   Shellfish Allergy Hives    Medications: Outpatient Medications Prior to Visit  Medication Sig   fluticasone (FLONASE) 50 MCG/ACT nasal spray Place into both nostrils daily.   lamoTRIgine (LAMICTAL) 100 MG tablet TAKE 1 TABLET BY MOUTH AT BEDTIME   Multiple Vitamin (MULTIVITAMIN) tablet Take 1 tablet by mouth daily.   No facility-administered medications prior to visit.   Review of Systems  Constitutional:  Negative for appetite change, chills, fatigue and fever.  HENT:  Negative for ear pain, sinus pressure, sinus pain and sore throat.   Eyes:  Negative for pain and visual disturbance.  Respiratory:  Negative for cough, chest tightness, shortness of breath and wheezing.   Cardiovascular:  Negative for chest pain, palpitations and leg swelling.  Gastrointestinal:  Negative for abdominal pain, blood in stool, diarrhea, nausea and vomiting.  Genitourinary:  Negative for flank pain, frequency, pelvic pain and urgency.  Musculoskeletal:  Negative for back pain, myalgias and neck pain.  Neurological:  Negative for dizziness, weakness, light-headedness, numbness and headaches.      Objective    There were no vitals taken for this  visit. BP Readings from Last 3 Encounters:  04/03/20 136/76  12/06/19 (!) 145/90  10/15/18 128/75    Wt Readings from Last 3 Encounters:  04/03/20 158 lb (71.7 kg)  12/06/19 158 lb (71.7 kg)  10/15/18 158 lb 12.8 oz (72 kg)    Physical Exam Vitals reviewed.  Constitutional:      General: She is not in acute distress.    Appearance: Normal appearance. She is well-developed. She is not diaphoretic.  HENT:     Head: Normocephalic and atraumatic.  Eyes:     General: No scleral  icterus.    Conjunctiva/sclera: Conjunctivae normal.  Neck:     Thyroid: No thyromegaly.  Cardiovascular:     Rate and Rhythm: Normal rate and regular rhythm.     Pulses: Normal pulses.     Heart sounds: Normal heart sounds. No murmur heard. Pulmonary:     Effort: Pulmonary effort is normal. No respiratory distress.     Breath sounds: Normal breath sounds. No wheezing, rhonchi or rales.  Musculoskeletal:     Cervical back: Neck supple.     Right lower leg: No edema.     Left lower leg: No edema.  Lymphadenopathy:     Cervical: No cervical adenopathy.  Skin:    General: Skin is warm and dry.     Findings: No rash.  Neurological:     Mental Status: She is alert and oriented to person, place, and time. Mental status is at baseline.  Psychiatric:        Mood and Affect: Mood normal.        Behavior: Behavior normal.    No results found for any visits on 08/25/20.  Assessment & Plan     Problem List Items Addressed This Visit       Cardiovascular and Mediastinum   Hypertension - Primary    Well controleld with lifestyle changes No meds needed at this time         Other   Affective bipolar disorder ([place])    Well controlled Continue lamictal       Relevant Medications   lamoTRIgine (LAMICTAL) 100 MG tablet      No follow-ups on file.      I,Essence Turner,acting as a Education administrator for [name], MD.,have documented all relevant documentation on the behalf of [name], MD,as directed by  [name], MD while in the presence of [name], MD.  I, [name], MD, have reviewed all documentation for this visit. The documentation on 08/25/20 for the exam, diagnosis, procedures, and orders are all accurate and complete.   Pankaj Haack, Dionne Bucy, MD, MPH [facility]

## 2020-08-25 NOTE — Patient Instructions (Signed)

## 2020-11-22 ENCOUNTER — Other Ambulatory Visit: Payer: Self-pay | Admitting: Family Medicine

## 2020-11-22 DIAGNOSIS — Z1231 Encounter for screening mammogram for malignant neoplasm of breast: Secondary | ICD-10-CM

## 2020-12-11 ENCOUNTER — Ambulatory Visit (INDEPENDENT_AMBULATORY_CARE_PROVIDER_SITE_OTHER): Payer: BC Managed Care – PPO | Admitting: Family Medicine

## 2020-12-11 ENCOUNTER — Telehealth: Payer: Self-pay

## 2020-12-11 ENCOUNTER — Other Ambulatory Visit: Payer: Self-pay

## 2020-12-11 ENCOUNTER — Encounter: Payer: Self-pay | Admitting: Family Medicine

## 2020-12-11 VITALS — BP 136/85 | HR 95 | Temp 98.1°F | Resp 16 | Ht 67.0 in | Wt 150.7 lb

## 2020-12-11 DIAGNOSIS — I10 Essential (primary) hypertension: Secondary | ICD-10-CM | POA: Diagnosis not present

## 2020-12-11 DIAGNOSIS — R87612 Low grade squamous intraepithelial lesion on cytologic smear of cervix (LGSIL): Secondary | ICD-10-CM

## 2020-12-11 DIAGNOSIS — Z23 Encounter for immunization: Secondary | ICD-10-CM | POA: Diagnosis not present

## 2020-12-11 DIAGNOSIS — F3170 Bipolar disorder, currently in remission, most recent episode unspecified: Secondary | ICD-10-CM

## 2020-12-11 DIAGNOSIS — Z0000 Encounter for general adult medical examination without abnormal findings: Secondary | ICD-10-CM | POA: Diagnosis not present

## 2020-12-11 MED ORDER — FLUTICASONE PROPIONATE 50 MCG/ACT NA SUSP: 2.0000 | Freq: Every day | NASAL | 3 refills | Status: AC

## 2020-12-11 NOTE — Assessment & Plan Note (Signed)
Well controlled  Continue lamictal

## 2020-12-11 NOTE — Telephone Encounter (Signed)
[facility] referring for LGSIL on Pap smear of cervix/ COLPO MD only. Called and left voicemail for patient to call back to be scheduled.

## 2020-12-11 NOTE — Assessment & Plan Note (Signed)
Well controlled with lifestyle management No meds at this time

## 2020-12-11 NOTE — Addendum Note (Signed)
Addended by: Virginia Crews on: 12/11/2020 01:12 PM   Modules accepted: Orders

## 2020-12-11 NOTE — Progress Notes (Signed)
Complete physical exam   Patient: Lisa Knox   DOB: [birth year]   40 y.o. Female  MRN: [id number] Visit Date: 12/11/2020  Today's healthcare provider: [name], MD   Chief Complaint  Patient presents with   Annual Exam   Subjective    Lisa Knox is a 40 y.o. female who presents today for a complete physical exam.  She reports consuming a general diet. Home exercise routine includes walking 4 hrs per week. She generally feels well. She reports sleeping well. She does not have additional problems to discuss today.  HPI   Mammogram scheduled for 12/19/20  Past Medical History:  Diagnosis Date   Anal fissure 2001   Depression    Headache    H/O   Past Surgical History:  Procedure Laterality Date   SIGMOIDOSCOPY  2001   WISDOM TOOTH EXTRACTION  2002   Social History   Socioeconomic History   Marital status: Single    Spouse name: Not on file   Number of children: 0   Years of education: 22   Highest education level: Master's degree (e.g., MA, MS, MEng, MEd, MSW, MBA)  Occupational History    Employer: [employer]  Tobacco Use   Smoking status: Never   Smokeless tobacco: Never  Vaping Use   Vaping Use: Never used  Substance and Sexual Activity   Alcohol use: Yes    Alcohol/week: 0.0 standard drinks    Comment: OCCASIONALLY   Drug use: No   Sexual activity: Yes    Birth control/protection: None  Other Topics Concern   Not on file  Social History Narrative   Not on file   Social Determinants of Health   Financial Resource Strain: Not on file  Food Insecurity: Not on file  Transportation Needs: Not on file  Physical Activity: Not on file  Stress: Not on file  Social Connections: Not on file  Intimate Partner Violence: Not on file   Family Status  Relation Name Status   Mother  Alive   Father  Alive   Brother 1 Alive   MGM  Deceased at age 59   MGF  Deceased   PGM  Deceased at age 53   PGF  Deceased at age 67    Brother 2 Alive   Mat 73  Alive   Family History  Problem Relation Age of Onset   Hypertension Mother    GER disease Mother    Colon polyps Father    Gallbladder disease Father    Crohn's disease Father    Hypertension Father    Hypertension Brother    Congestive Heart Failure Maternal Grandmother    Diabetes Paternal Grandmother    Dementia Paternal Grandfather    Thyroid disease Brother    Breast cancer Maternal Aunt    Allergies  Allergen Reactions   Pollen Extract Other (See Comments)    Pet dander- Hives   Shellfish Allergy Hives    Patient Care Team: Israa Caban, Dionne Bucy, MD as PCP - General (Family Medicine) Christene Lye, MD (General Surgery)   Medications: Outpatient Medications Prior to Visit  Medication Sig   lamoTRIgine (LAMICTAL) 100 MG tablet Take 1 tablet (100 mg total) by mouth at bedtime.   Multiple Vitamin (MULTIVITAMIN) tablet Take 1 tablet by mouth daily.   [DISCONTINUED] fluticasone (FLONASE) 50 MCG/ACT nasal spray Place into both nostrils daily. (Patient not taking: Reported on 12/11/2020)   No facility-administered medications prior to visit.  Review of Systems  Constitutional: Negative.   HENT: Negative.    Eyes: Negative.   Cardiovascular: Negative.   Gastrointestinal: Negative.   Endocrine: Negative.   Genitourinary: Negative.   Musculoskeletal: Negative.   Skin: Negative.   Allergic/Immunologic: Negative.   Neurological: Negative.   Hematological: Negative.   Psychiatric/Behavioral: Negative.     Last CBC Lab Results  Component Value Date   WBC 5.5 12/06/2019   HGB 13.7 12/06/2019   HCT 38.9 12/06/2019   MCV 88 12/06/2019   MCH 31.1 12/06/2019   RDW 11.9 12/06/2019   PLT 298 74/09/1446   Last metabolic panel Lab Results  Component Value Date   GLUCOSE 84 12/06/2019   NA 138 12/06/2019   K 3.9 12/06/2019   CL 102 12/06/2019   CO2 22 12/06/2019   BUN 12 12/06/2019   CREATININE 0.99 12/06/2019   GFRNONAA 71  12/06/2019   CALCIUM 9.1 12/06/2019   PROT 8.4 12/06/2019   ALBUMIN 4.4 12/06/2019   LABGLOB 4.0 12/06/2019   AGRATIO 1.1 (L) 12/06/2019   BILITOT 0.7 12/06/2019   ALKPHOS 68 12/06/2019   AST 11 12/06/2019   ALT 12 12/06/2019   Last lipids Lab Results  Component Value Date   CHOL 185 12/06/2019   HDL 66 12/06/2019   LDLCALC 105 (H) 12/06/2019   TRIG 76 12/06/2019   CHOLHDL 2.8 12/06/2019   Last thyroid functions Lab Results  Component Value Date   TSH 1.570 10/04/2014      Objective    BP 136/85 Comment: home reading  Pulse 95   Temp 98.1 F (36.7 C) (Temporal)   Resp 16   Ht 5\' 7"  (1.702 m)   Wt 150 lb 11.2 oz (68.4 kg)   LMP 11/22/2020 (Exact Date)   SpO2 97%   BMI 23.60 kg/m  BP Readings from Last 3 Encounters:  12/11/20 136/85  08/25/20 130/87  04/03/20 136/76   Wt Readings from Last 3 Encounters:  12/11/20 150 lb 11.2 oz (68.4 kg)  08/25/20 158 lb (71.7 kg)  04/03/20 158 lb (71.7 kg)      Physical Exam Vitals reviewed.  Constitutional:      General: She is not in acute distress.    Appearance: Normal appearance. She is well-developed. She is not diaphoretic.  HENT:     Head: Normocephalic and atraumatic.     Right Ear: Tympanic membrane, ear canal and external ear normal.     Left Ear: Tympanic membrane, ear canal and external ear normal.  Eyes:     General: No scleral icterus.    Conjunctiva/sclera: Conjunctivae normal.     Pupils: Pupils are equal, round, and reactive to light.  Neck:     Thyroid: No thyromegaly.  Cardiovascular:     Rate and Rhythm: Normal rate and regular rhythm.     Pulses: Normal pulses.     Heart sounds: Normal heart sounds. No murmur heard. Pulmonary:     Effort: Pulmonary effort is normal. No respiratory distress.     Breath sounds: Normal breath sounds. No wheezing or rales.  Abdominal:     General: There is no distension.     Palpations: Abdomen is soft.     Tenderness: There is no abdominal tenderness.   Musculoskeletal:        General: No deformity.     Cervical back: Neck supple.     Right lower leg: No edema.     Left lower leg: No edema.  Lymphadenopathy:  Cervical: No cervical adenopathy.  Skin:    General: Skin is warm and dry.     Findings: No rash.  Neurological:     Mental Status: She is alert and oriented to person, place, and time. Mental status is at baseline.     Sensory: No sensory deficit.     Motor: No weakness.     Gait: Gait normal.  Psychiatric:        Mood and Affect: Mood normal.        Behavior: Behavior normal.        Thought Content: Thought content normal.      Last depression screening scores PHQ 2/9 Scores 12/11/2020 08/25/2020 04/03/2020  PHQ - 2 Score 0 0 0  PHQ- 9 Score 0 0 0   Last fall risk screening Fall Risk  12/11/2020  Falls in the past year? 0  Number falls in past yr: 0  Injury with Fall? 0  Risk for fall due to : No Fall Risks  Follow up Falls evaluation completed   Last Audit-C alcohol use screening Alcohol Use Disorder Test (AUDIT) 12/11/2020  1. How often do you have a drink containing alcohol? 2  2. How many drinks containing alcohol do you have on a typical day when you are drinking? 0  3. How often do you have six or more drinks on one occasion? 0  AUDIT-C Score 2  Alcohol Brief Interventions/Follow-up -   A score of 3 or more in women, and 4 or more in men indicates increased risk for alcohol abuse, EXCEPT if all of the points are from question 1   No results found for any visits on 12/11/20.  Assessment & Plan    Routine Health Maintenance and Physical Exam  Exercise Activities and Dietary recommendations  Goals   None     Immunization History  Administered Date(s) Administered   Influenza,inj,Quad PF,6+ Mos 01/30/2015, 01/13/2017, 01/14/2018, 10/15/2018, 12/06/2019, 12/11/2020   PFIZER(Purple Top)SARS-COV-2 Vaccination 04/13/2019, 05/12/2019, 01/05/2020   Tdap 10/28/2011    Health Maintenance  Topic  Date Due   COVID-19 Vaccine (4 - Booster for Pfizer series) 03/01/2020   TETANUS/TDAP  10/27/2021   PAP SMEAR-Modifier  12/06/2022   INFLUENZA VACCINE  Completed   Hepatitis C Screening  Completed   HIV Screening  Completed   Pneumococcal Vaccine 82-25 Years old  Aged Out   HPV VACCINES  Aged Out    Discussed health benefits of physical activity, and encouraged her to engage in regular exercise appropriate for her age and condition.  Problem List Items Addressed This Visit       Cardiovascular and Mediastinum   Hypertension    Well controlled with lifestyle management No meds at this time      Relevant Orders   Comprehensive metabolic panel   CBC   Lipid panel     Other   Affective bipolar disorder ([place])    Well controlled  Continue lamictal      Other Visit Diagnoses     Annual physical exam    -  Primary   Relevant Orders   Comprehensive metabolic panel   CBC   Lipid panel   Need for influenza vaccination       Relevant Orders   Flu Vaccine QUAD 69mo+IM (Fluarix, Fluzone & Alfiuria Quad PF) (Completed)   LGSIL on Pap smear of cervix       Relevant Orders   Ambulatory referral to Gynecology        Return  in about 1 year (around 12/11/2021) for CPE.     I, [name], MD, have reviewed all documentation for this visit. The documentation on 12/11/20 for the exam, diagnosis, procedures, and orders are all accurate and complete.   Tanysha Quant, Dionne Bucy, MD, MPH [facility]

## 2020-12-12 ENCOUNTER — Telehealth: Payer: Self-pay

## 2020-12-12 DIAGNOSIS — D508 Other iron deficiency anemias: Secondary | ICD-10-CM

## 2020-12-12 LAB — COMPREHENSIVE METABOLIC PANEL
ALT: 24 IU/L (ref 0–32)
AST: 21 IU/L (ref 0–40)
Albumin/Globulin Ratio: 1.1 — ABNORMAL LOW (ref 1.2–2.2)
Albumin: 4.4 g/dL (ref 3.8–4.8)
Alkaline Phosphatase: 72 IU/L (ref 44–121)
BUN/Creatinine Ratio: 12 (ref 9–23)
BUN: 10 mg/dL (ref 6–24)
Bilirubin Total: 0.7 mg/dL (ref 0.0–1.2)
CO2: 21 mmol/L (ref 20–29)
Calcium: 8.8 mg/dL (ref 8.7–10.2)
Chloride: 102 mmol/L (ref 96–106)
Creatinine, Ser: 0.83 mg/dL (ref 0.57–1.00)
Globulin, Total: 4.1 g/dL (ref 1.5–4.5)
Glucose: 90 mg/dL (ref 70–99)
Potassium: 4.1 mmol/L (ref 3.5–5.2)
Sodium: 137 mmol/L (ref 134–144)
Total Protein: 8.5 g/dL (ref 6.0–8.5)
eGFR: 90 mL/min/{1.73_m2} (ref >59)

## 2020-12-12 LAB — CBC
Hematocrit: 26.1 % — ABNORMAL LOW (ref 34.0–46.6)
Hemoglobin: 7.4 g/dL — ABNORMAL LOW (ref 11.1–15.9)
MCH: 18.8 pg — ABNORMAL LOW (ref 26.6–33.0)
MCHC: 28.4 g/dL — ABNORMAL LOW (ref 31.5–35.7)
MCV: 66 fL — ABNORMAL LOW (ref 79–97)
Platelets: 437 10*3/uL (ref 150–450)
RBC: 3.94 x10E6/uL (ref 3.77–5.28)
RDW: 18.2 % — ABNORMAL HIGH (ref 11.7–15.4)
WBC: 5 10*3/uL (ref 3.4–10.8)

## 2020-12-12 LAB — LIPID PANEL
Chol/HDL Ratio: 2.3 ratio (ref 0.0–4.4)
Cholesterol, Total: 164 mg/dL (ref 100–199)
HDL: 70 mg/dL (ref >39)
LDL Chol Calc (NIH): 85 mg/dL (ref 0–99)
Triglycerides: 42 mg/dL (ref 0–149)
VLDL Cholesterol Cal: 9 mg/dL (ref 5–40)

## 2020-12-12 NOTE — Telephone Encounter (Signed)
Called and left voicemail for patient to call back to be scheduled. 

## 2020-12-12 NOTE — Telephone Encounter (Signed)
-----   Message from Virginia Crews, MD sent at 12/12/2020  9:00 AM EDT ----- Normal labs, except new anemia.  Suspect iron deficiency (please add on iron panel with ferritin and iron saturation). Any bleeding (vaginal, rectal, etc)?  Start ferrous sulfate 325mg  twice daily OTC. Recheck CBC and iron panel in 1 month.  If symptomatic (lightheadedness, shortness of breath, etc), may need to see hematology.

## 2020-12-13 NOTE — Telephone Encounter (Signed)
Called and left voicemail for patient to call back to be scheduled. 

## 2020-12-13 NOTE — Telephone Encounter (Signed)
Pt is scheduled on 11/22 with SDJ at 3:10.

## 2020-12-14 LAB — IRON,TIBC AND FERRITIN PANEL
Ferritin: 9 ng/mL — ABNORMAL LOW (ref 15–150)
Iron Saturation: 3 % — CL (ref 15–55)
Iron: 13 ug/dL — ABNORMAL LOW (ref 27–159)
Total Iron Binding Capacity: 439 ug/dL (ref 250–450)
UIBC: 426 ug/dL — ABNORMAL HIGH (ref 131–425)

## 2020-12-14 LAB — SPECIMEN STATUS REPORT

## 2020-12-14 NOTE — Telephone Encounter (Signed)
Reviewed iron panel and maintain recommendation for iron supplement.

## 2020-12-19 ENCOUNTER — Other Ambulatory Visit: Payer: Self-pay

## 2020-12-19 ENCOUNTER — Ambulatory Visit
Admission: RE | Admit: 2020-12-19 | Discharge: 2020-12-19 | Disposition: A | Discharge status: Home / Self Care | Payer: BC Managed Care – PPO | Source: Ambulatory Visit

## 2020-12-19 DIAGNOSIS — Z1231 Encounter for screening mammogram for malignant neoplasm of breast: Secondary | ICD-10-CM

## 2020-12-22 ENCOUNTER — Other Ambulatory Visit: Payer: Self-pay | Admitting: Family Medicine

## 2020-12-22 DIAGNOSIS — R928 Other abnormal and inconclusive findings on diagnostic imaging of breast: Secondary | ICD-10-CM

## 2021-01-02 ENCOUNTER — Ambulatory Visit: Payer: PRIVATE HEALTH INSURANCE | Admitting: Obstetrics and Gynecology

## 2021-01-11 ENCOUNTER — Other Ambulatory Visit: Payer: Self-pay

## 2021-01-11 ENCOUNTER — Encounter: Payer: Self-pay | Admitting: Obstetrics and Gynecology

## 2021-01-11 ENCOUNTER — Ambulatory Visit (INDEPENDENT_AMBULATORY_CARE_PROVIDER_SITE_OTHER): Payer: BC Managed Care – PPO | Admitting: Obstetrics and Gynecology

## 2021-01-11 VITALS — BP 116/70 | Ht 67.2 in | Wt 149.6 lb

## 2021-01-11 DIAGNOSIS — B379 Candidiasis, unspecified: Secondary | ICD-10-CM | POA: Diagnosis not present

## 2021-01-11 DIAGNOSIS — R1900 Intra-abdominal and pelvic swelling, mass and lump, unspecified site: Secondary | ICD-10-CM

## 2021-01-11 MED ORDER — FLUCONAZOLE 150 MG PO TABS: 150.0000 mg | ORAL_TABLET | ORAL | 0 refills | Status: AC

## 2021-01-11 NOTE — Progress Notes (Signed)
Patient ID: Lisa Knox, female   DOB: 18-Feb-1980, 40 y.o.   MRN: [id number]  Reason for Consult: Gynecologic Exam   Referred by Virginia Crews, MD  Subjective:     HPI:  Lisa Knox is a 40 y.o. female- She is here for a colposcopy but her cervix is difficult to visualize because of a large cervical fibroid  Gynecological History  No LMP recorded.  Past Medical History:  Diagnosis Date   Anal fissure 2001   Depression    Headache    H/O   Family History  Problem Relation Age of Onset   Hypertension Mother    GER disease Mother    Colon polyps Father    Gallbladder disease Father    Crohn's disease Father    Hypertension Father    Hypertension Brother    Congestive Heart Failure Maternal Grandmother    Diabetes Paternal Grandmother    Dementia Paternal Grandfather    Thyroid disease Brother    Breast cancer Maternal Aunt    Past Surgical History:  Procedure Laterality Date   SIGMOIDOSCOPY  2001   WISDOM TOOTH EXTRACTION  2002    Short Social History:  Social History   Tobacco Use   Smoking status: Never   Smokeless tobacco: Never  Substance Use Topics   Alcohol use: Yes    Alcohol/week: 0.0 standard drinks    Comment: OCCASIONALLY    Allergies  Allergen Reactions   Pollen Extract Other (See Comments)    Pet dander- Hives   Shellfish Allergy Hives    Current Outpatient Medications  Medication Sig Dispense Refill   fluconazole (DIFLUCAN) 150 MG tablet Take 1 tablet (150 mg total) by mouth every 3 (three) days for 2 doses. 2 tablet 0   fluticasone (FLONASE) 50 MCG/ACT nasal spray Place 2 sprays into both nostrils daily. 48 g 3   lamoTRIgine (LAMICTAL) 100 MG tablet Take 1 tablet (100 mg total) by mouth at bedtime. 60 tablet 5   Multiple Vitamin (MULTIVITAMIN) tablet Take 1 tablet by mouth daily.     No current facility-administered medications for this visit.    Review of Systems  Constitutional: Negative for chills, fatigue, fever and  unexpected weight change.  HENT: Negative for trouble swallowing.  Eyes: Negative for loss of vision.  Respiratory: Negative for cough, shortness of breath and wheezing.  Cardiovascular: Negative for chest pain, leg swelling, palpitations and syncope.  GI: Negative for abdominal pain, blood in stool, diarrhea, nausea and vomiting.  GU: Negative for difficulty urinating, dysuria, frequency and hematuria.  Musculoskeletal: Negative for back pain, leg pain and joint pain.  Skin: Negative for rash.  Neurological: Negative for dizziness, headaches, light-headedness, numbness and seizures.  Psychiatric: Negative for behavioral problem, confusion, depressed mood and sleep disturbance.       Objective:  Objective   Vitals:   01/11/21 0859  BP: 116/70  Weight: 149 lb 9.6 oz (67.9 kg)  Height: 5' 7.2" (1.707 m)   Body mass index is 23.29 kg/m.  Physical Exam Vitals and nursing note reviewed. Exam conducted with a chaperone present.  Constitutional:      Appearance: Normal appearance. She is well-developed.  HENT:     Head: Normocephalic and atraumatic.  Eyes:     Extraocular Movements: Extraocular movements intact.     Pupils: Pupils are equal, round, and reactive to light.  Cardiovascular:     Rate and Rhythm: Normal rate and regular rhythm.  Pulmonary:  Effort: Pulmonary effort is normal. No respiratory distress.     Breath sounds: Normal breath sounds.  Abdominal:     General: Abdomen is flat.     Palpations: Abdomen is soft.  Genitourinary:    Comments: External: Normal appearing vulva. No lesions noted.  Speculum examination: Cervix not able to be visualized because of large cervical fibroid Bimanual examination:Enlarged, irregular fibroid uterus. No adnexal masses. No adnexal tenderness. Pelvis not fixed.  Breast exam: exam not performed Musculoskeletal:        General: No signs of injury.  Skin:    General: Skin is warm and dry.  Neurological:     Mental Status:  She is alert and oriented to person, place, and time.  Psychiatric:        Behavior: Behavior normal.        Thought Content: Thought content normal.        Judgment: Judgment normal.    Assessment/Plan:     40 yo with suspected uterine fibroids making visualization of her cervix for colposcopy not possible today.  Will obtain pelvic imaging. May need colposcopy in the OR.  More than 10 minutes were spent face to face with the patient in the room, reviewing the medical record, labs and images, and coordinating care for the patient. The plan of management was discussed in detail and counseling was provided.        Adrian Prows MD [facility], [facility] 01/11/2021 9:54 AM

## 2021-01-12 ENCOUNTER — Ambulatory Visit: Payer: BC Managed Care – PPO

## 2021-01-12 ENCOUNTER — Other Ambulatory Visit: Payer: Self-pay | Admitting: Obstetrics and Gynecology

## 2021-01-12 DIAGNOSIS — R1900 Intra-abdominal and pelvic swelling, mass and lump, unspecified site: Secondary | ICD-10-CM

## 2021-01-12 LAB — CBC WITH DIFFERENTIAL/PLATELET
Basophils Absolute: 0.1 10*3/uL (ref 0.0–0.2)
Basos: 1 %
EOS (ABSOLUTE): 0.3 10*3/uL (ref 0.0–0.4)
Eos: 7 %
Hematocrit: 36.6 % (ref 34.0–46.6)
Hemoglobin: 11.7 g/dL (ref 11.1–15.9)
Immature Grans (Abs): 0 10*3/uL (ref 0.0–0.1)
Immature Granulocytes: 0 %
Lymphocytes Absolute: 1.3 10*3/uL (ref 0.7–3.1)
Lymphs: 29 %
MCH: 25.5 pg — ABNORMAL LOW (ref 26.6–33.0)
MCHC: 32 g/dL (ref 31.5–35.7)
MCV: 80 fL (ref 79–97)
Monocytes Absolute: 0.4 10*3/uL (ref 0.1–0.9)
Monocytes: 10 %
Neutrophils Absolute: 2.3 10*3/uL (ref 1.4–7.0)
Neutrophils: 53 %
Platelets: 275 10*3/uL (ref 150–450)
RBC: 4.58 x10E6/uL (ref 3.77–5.28)
WBC: 4.3 10*3/uL (ref 3.4–10.8)

## 2021-01-12 LAB — IRON,TIBC AND FERRITIN PANEL
Ferritin: 60 ng/mL (ref 15–150)
Iron Saturation: 20 % (ref 15–55)
Iron: 67 ug/dL (ref 27–159)
Total Iron Binding Capacity: 337 ug/dL (ref 250–450)
UIBC: 270 ug/dL (ref 131–425)

## 2021-01-15 ENCOUNTER — Ambulatory Visit: Payer: BC Managed Care – PPO | Admitting: Obstetrics and Gynecology

## 2021-01-15 ENCOUNTER — Other Ambulatory Visit: Payer: Self-pay

## 2021-01-15 ENCOUNTER — Ambulatory Visit
Admission: RE | Admit: 2021-01-15 | Discharge: 2021-01-15 | Disposition: A | Discharge status: Home / Self Care | Payer: BC Managed Care – PPO | Source: Ambulatory Visit | Attending: Obstetrics and Gynecology | Admitting: Obstetrics and Gynecology

## 2021-01-15 ENCOUNTER — Other Ambulatory Visit: Payer: Self-pay | Admitting: Obstetrics and Gynecology

## 2021-01-15 DIAGNOSIS — R1900 Intra-abdominal and pelvic swelling, mass and lump, unspecified site: Secondary | ICD-10-CM | POA: Insufficient documentation

## 2021-01-18 ENCOUNTER — Ambulatory Visit: Payer: BC Managed Care – PPO | Admitting: Obstetrics and Gynecology

## 2021-01-18 ENCOUNTER — Other Ambulatory Visit: Payer: Self-pay

## 2021-01-18 ENCOUNTER — Encounter: Payer: Self-pay | Admitting: Obstetrics and Gynecology

## 2021-01-18 ENCOUNTER — Other Ambulatory Visit: Payer: BC Managed Care – PPO

## 2021-01-18 DIAGNOSIS — D252 Subserosal leiomyoma of uterus: Secondary | ICD-10-CM

## 2021-01-18 NOTE — Patient Instructions (Signed)
Uterine Fibroids ?Uterine fibroids, also called leiomyomas, are noncancerous (benign) tumors that can grow in the uterus. They can cause heavy menstrual bleeding and pain. Fibroids may also grow in the fallopian tubes, cervix, or tissues (ligaments) near the uterus. ?You may have one or many fibroids. Fibroids vary in size, weight, and where they grow in the uterus. Some can become quite large. Most fibroids do not require medical treatment. ?What are the causes? ?The cause of this condition is not known. ?What increases the risk? ?You are more likely to develop this condition if you: ?Are in your 30s or 40s and have not gone through menopause. ?Have a family history of this condition. ?Are of African American descent. ?Started your menstrual period at age 10 or younger. ?Have never given birth. ?Are overweight or obese. ?What are the signs or symptoms? ?Many women do not have any symptoms. Symptoms of this condition may include: ?Heavy menstrual bleeding. ?Bleeding between menstrual periods. ?Pain and pressure in the pelvic area, between your hip bones. ?Pain during sex. ?Bladder problems, such as needing to urinate right away or more often than usual. ?Inability to have children (infertility). ?Failure to carry pregnancy to term (miscarriage). ?How is this diagnosed? ?This condition may be diagnosed based on: ?Your symptoms and medical history. ?A physical exam. ?A pelvic exam that includes feeling for any tumors. ?Imaging tests, such as ultrasound or MRI. ?How is this treated? ?Treatment for this condition may include follow-up visits with your health care provider to monitor your fibroids for any changes. Other treatment may include: ?Medicines, such as: ?Medicines to relieve pain, including aspirin and NSAIDs, such as ibuprofen or naproxen. ?Hormone therapy. Treatment may be given as a pill or an injection, or it may be inserted into the uterus using an intrauterine device (IUD). ?Surgery that would do one of  the following: ?Remove the fibroids (myomectomy). This may be recommended if fibroids affect your fertility and you want to become pregnant. ?Remove the uterus (hysterectomy). ?Block the blood supply to the fibroids (uterine artery embolization). This can cause them to shrink and die. ?Follow these instructions at home: ?Medicines ?Take over-the-counter and prescription medicines only as told by your health care provider. ?Ask your health care provider if you should take iron pills or eat more iron-rich foods, such as dark green, leafy vegetables. Heavy menstrual bleeding can cause low iron levels. ?Managing pain ?If directed, apply heat to your back or abdomen to reduce pain. Use the heat source that your health care provider recommends, such as a moist heat pack or a heating pad. To apply heat: ?Place a towel between your skin and the heat source. ?Leave the heat on for 20-30 minutes. ?Remove the heat if your skin turns bright red. This is especially important if you are unable to feel pain, heat, or cold. You may have a greater risk of getting burned. ? ?General instructions ?Pay close attention to your menstrual cycle. Tell your health care provider about any changes, such as: ?Heavier bleeding that requires you to change your pads or tampons more than usual. ?A change in the number of days that your menstrual period lasts. ?A change in symptoms that come with your menstrual period, such as back pain or cramps in your abdomen. ?Keep all follow-up visits. This is important, especially if your fibroids need to be monitored for any changes. ?Contact a health care provider if you: ?Have pelvic pain, back pain, or cramps in your abdomen that do not get better with medicine   or heat. ?Develop new bleeding between menstrual periods. ?Have increased bleeding during or between menstrual periods. ?Feel more tired or weak than usual. ?Feel light-headed. ?Get help right away if you: ?Faint. ?Have pelvic pain that suddenly  gets worse. ?Have severe vaginal bleeding that soaks a tampon or pad in 30 minutes or less. ?Summary ?Uterine fibroids are noncancerous (benign) tumors that can develop in the uterus. ?The exact cause of this condition is not known. ?Most fibroids do not require medical treatment unless they affect your ability to have children (fertility). ?Contact a health care provider if you have pelvic pain, back pain, or cramps in your abdomen that do not get better with medicines. ?Get help right away if you faint, have pelvic pain that suddenly gets worse, or have severe vaginal bleeding. ?This information is not intended to replace advice given to you by your health care provider. Make sure you discuss any questions you have with your health care provider. ?Document Revised: 08/31/2019 Document Reviewed: 08/31/2019 ?Elsevier Patient Education ? 2022 Elsevier Inc. ? ?

## 2021-01-18 NOTE — Progress Notes (Signed)
Virtual Visit via Telephone Note  I connected with Lisa Knox on 01/18/21 at  1:50 PM EST by telephone and verified that I am speaking with the correct person using two identifiers.   I discussed the limitations, risks, security and privacy concerns of performing an evaluation and management service by telephone and the availability of in person appointments. I also discussed with the patient that there may be a patient responsible charge related to this service. The patient expressed understanding and agreed to proceed.  The patient was at work I spoke with the patient from my  office The names of people involved in this encounter were: Dr. Nechama Guard and Wende Mott History of Present Illness: She is following up today to discuss her pelvic ultrasound result.  Previously we had attempted colposcopy in office however given her pelvic mass from a enlarged fibroid uterus were not able to be successful.  She has no complaints.  She had a pelvic ultrasound which confirmed the presence of uterine fibroids.   Observations/Objective:  Physical Exam could not be performed. Because of the COVID-19 outbreak this visit was performed over the phone and not in person.   Assessment and Plan: 40 year old with enlarged fibroid uterus.  Fibroids are in the lower uterine segment and obscure the cervix making colposcopy and possible form in office.  We reviewed treatment options for uterine fibroids.  Patient denies significant menorrhagia or pelvic pain.  We discussed options for myomectomy, hysterectomy, fibroid embolization.  She will consider these options and let us know if she would like to move forward.  Since patient has had a abnormal Pap smear colposcopy will be important.  Colposcopy cannot be performed in the office and will still be challenging in the operative room.  However we will plan for colposcopy in operating room as well as endometrial biopsy should the patient desire hysterectomy in the future.   Note sent to surgical scheduler  Follow Up Instructions: For colposcopy   I discussed the assessment and treatment plan with the patient. The patient was provided an opportunity to ask questions and all were answered. The patient agreed with the plan and demonstrated an understanding of the instructions.   The patient was advised to call back or seek an in-person evaluation if the symptoms worsen or if the condition fails to improve as anticipated.  I provided 10 minutes of non-face-to-face time during this encounter.  Adrian Prows MD [facility], [facility] 01/18/2021 2:23 PM

## 2021-01-19 ENCOUNTER — Ambulatory Visit
Admission: RE | Admit: 2021-01-19 | Discharge: 2021-01-19 | Disposition: A | Discharge status: Home / Self Care | Payer: BC Managed Care – PPO | Source: Ambulatory Visit | Attending: Family Medicine | Admitting: Family Medicine

## 2021-01-19 DIAGNOSIS — R928 Other abnormal and inconclusive findings on diagnostic imaging of breast: Secondary | ICD-10-CM

## 2021-01-26 ENCOUNTER — Other Ambulatory Visit: Payer: BC Managed Care – PPO

## 2021-01-26 ENCOUNTER — Telehealth: Payer: Self-pay | Admitting: Obstetrics and Gynecology

## 2021-01-26 ENCOUNTER — Ambulatory Visit: Payer: BC Managed Care – PPO | Admitting: Obstetrics and Gynecology

## 2021-01-26 NOTE — Telephone Encounter (Signed)
-----   Message from Homero Fellers, MD sent at 01/18/2021  2:20 PM EST ----- Surgery Booking Request Patient Full Name:  Lisa Knox  MRN: [id number]  DOB: [birth year]  Surgeon: Homero Fellers, MD  Requested Surgery Date and Time: Jan 2023 Primary Diagnosis AND Code: Abnormal pap smear, menorrhagia Secondary Diagnosis and Code:  Surgical Procedure: colposcopy, EMB RNFA Requested?: No L&D Notification: No Admission Status: same day surgery Length of Surgery: 50 min Special Case Needs: No H&P: No Phone Interview???:  Yes Interpreter: No Medical Clearance:  No Special Scheduling Instructions: No Any known health/anesthesia issues, diabetes, sleep apnea, latex allergy, defibrillator/pacemaker?: No Acuity: P3   (P1 highest, P2 delay may cause harm, P3 low, elective gyn, P4 lowest) Post op follow up visits: 1 weeks

## 2021-01-26 NOTE — Telephone Encounter (Signed)
Attempted to reach the patient for OR scheduling. Left  message to return call on voicemail.

## 2021-03-13 ENCOUNTER — Telehealth: Payer: Self-pay

## 2021-03-13 NOTE — Telephone Encounter (Signed)
Patient rtn'd call to schedule colposcopy, endometrial biopsy w [facility]  DOS 04/17/21  H&P n/a  Pre-admit phone call appointment to be requested. Also all appointments will be updated on pt MyChart. Explained that this appointment has a call window. Based on the time scheduled will indicate if the call will be received within a 4 hour window before 1:00 or after.  Advised that pt may also receive calls from the hospital pharmacy and pre-service center.  Confirmed pt has [employer] as Chartered certified accountant. No secondary insurance.   She requested for surgery to be late in March or early [place]. I advised that [facility] is leaving the practice in [place] and that will not be doing surgeries after 04/17/21.

## 2021-03-13 NOTE — Telephone Encounter (Signed)
[facility]

## 2021-03-19 NOTE — Telephone Encounter (Signed)
Orders placed.

## 2021-04-10 ENCOUNTER — Other Ambulatory Visit: Payer: Self-pay

## 2021-04-10 ENCOUNTER — Other Ambulatory Visit
Admission: RE | Admit: 2021-04-10 | Discharge: 2021-04-10 | Disposition: A | Discharge status: Home / Self Care | Payer: BC Managed Care – PPO | Source: Ambulatory Visit | Attending: Obstetrics and Gynecology | Admitting: Obstetrics and Gynecology

## 2021-04-10 DIAGNOSIS — Z01812 Encounter for preprocedural laboratory examination: Secondary | ICD-10-CM

## 2021-04-10 HISTORY — DX: Essential (primary) hypertension: I10

## 2021-04-10 HISTORY — DX: Anemia, unspecified: D64.9

## 2021-04-10 NOTE — Patient Instructions (Addendum)
Your procedure is scheduled on: 04/17/21 - Tuesday Report to the Registration Desk on the 1st floor of the [facility]. To find out your arrival time, please call [phone] between 1PM - 3PM on: 04/16/21 - Monday Report to [facility] for labs/EKG on 04/13/21 at 8 am.  REMEMBER: Instructions that are not followed completely may result in serious medical risk, up to and including death; or upon the discretion of your surgeon and anesthesiologist your surgery may need to be rescheduled.  Do not eat food after midnight the night before surgery.  No gum chewing, lozengers or hard candies.  You may however, drink CLEAR liquids up to 2 hours before you are scheduled to arrive for your surgery. Do not drink anything within 2 hours of your scheduled arrival time.  Clear liquids include: - water  - apple juice without pulp - gatorade (not RED colors) - black coffee or tea (Do NOT add milk or creamers to the coffee or tea) Do NOT drink anything that is not on this list.  In addition, your doctor has ordered for you to drink the provided  Ensure Pre-Surgery Clear Carbohydrate Drink  Drinking this carbohydrate drink up to two hours before surgery helps to reduce insulin resistance and improve patient outcomes. Please complete drinking 2 hours prior to scheduled arrival time.  TAKE THESE MEDICATIONS THE MORNING OF SURGERY WITH A SIP OF WATER: NONE  One week prior to surgery: Stop Anti-inflammatories (NSAIDS) such as Advil, Aleve, Ibuprofen, Motrin, Naproxen, Naprosyn and Aspirin based products such as Excedrin, Goodys Powder, BC Powder.  Stop ANY OVER THE COUNTER supplements until after surgery.  You may however, continue to take Tylenol if needed for pain up until the day of surgery.  No Alcohol for 24 hours before or after surgery.  No Smoking including e-cigarettes for 24 hours prior to surgery.  No chewable tobacco products for at least 6 hours prior to surgery.  No nicotine  patches on the day of surgery.  Do not use any "recreational" drugs for at least a week prior to your surgery.  Please be advised that the combination of cocaine and anesthesia may have negative outcomes, up to and including death. If you test positive for cocaine, your surgery will be cancelled.  On the morning of surgery brush your teeth with toothpaste and water, you may rinse your mouth with mouthwash if you wish. Do not swallow any toothpaste or mouthwash.  Do not wear jewelry, make-up, hairpins, clips or nail polish.  Do not wear lotions, powders, or perfumes.   Do not shave body from the neck down 48 hours prior to surgery just in case you cut yourself which could leave a site for infection.  Also, freshly shaved skin may become irritated if using the CHG soap.  Contact lenses, hearing aids and dentures may not be worn into surgery.  Do not bring valuables to the hospital. [facility] is not responsible for any missing/lost belongings or valuables.   Notify your doctor if there is any change in your medical condition (cold, fever, infection).  Wear comfortable clothing (specific to your surgery type) to the hospital.  After surgery, you can help prevent lung complications by doing breathing exercises.  Take deep breaths and cough every 1-2 hours. Your doctor may order a device called an Incentive Spirometer to help you take deep breaths. When coughing or sneezing, hold a pillow firmly against your incision with both hands. This is called splinting. Doing this helps  protect your incision. It also decreases belly discomfort.  If you are being admitted to the hospital overnight, leave your suitcase in the car. After surgery it may be brought to your room.  If you are being discharged the day of surgery, you will not be allowed to drive home. You will need a responsible adult (18 years or older) to drive you home and stay with you that night.   If you are taking public  transportation, you will need to have a responsible adult (18 years or older) with you. Please confirm with your physician that it is acceptable to use public transportation.   Please call the [facility] at [phone] if you have any questions about these instructions.  Surgery Visitation Policy:  Patients undergoing a surgery or procedure may have one family member or support person with them as long as that person is not COVID-19 positive or experiencing its symptoms.  That person may remain in the waiting area during the procedure and may rotate out with other people.  Inpatient Visitation:    Visiting hours are 7 a.m. to 8 p.m. Up to two visitors ages 16+ are allowed at one time in a patient room. The visitors may rotate out with other people during the day. Visitors must check out when they leave, or other visitors will not be allowed. One designated support person may remain overnight. The visitor must pass COVID-19 screenings, use hand sanitizer when entering and exiting the patients room and wear a mask at all times, including in the patients room. Patients must also wear a mask when staff or their visitor are in the room. Masking is required regardless of vaccination status.

## 2021-04-16 ENCOUNTER — Encounter: Payer: Self-pay | Admitting: Urgent Care

## 2021-04-16 ENCOUNTER — Other Ambulatory Visit: Payer: Self-pay

## 2021-04-16 ENCOUNTER — Other Ambulatory Visit
Admission: RE | Admit: 2021-04-16 | Discharge: 2021-04-16 | Disposition: A | Discharge status: Home / Self Care | Payer: BC Managed Care – PPO | Source: Ambulatory Visit | Attending: Obstetrics and Gynecology | Admitting: Obstetrics and Gynecology

## 2021-04-16 DIAGNOSIS — Z01812 Encounter for preprocedural laboratory examination: Secondary | ICD-10-CM

## 2021-04-16 DIAGNOSIS — B379 Candidiasis, unspecified: Secondary | ICD-10-CM | POA: Diagnosis not present

## 2021-04-16 DIAGNOSIS — R87612 Low grade squamous intraepithelial lesion on cytologic smear of cervix (LGSIL): Secondary | ICD-10-CM

## 2021-04-16 DIAGNOSIS — Z01818 Encounter for other preprocedural examination: Secondary | ICD-10-CM | POA: Insufficient documentation

## 2021-04-16 DIAGNOSIS — A630 Anogenital (venereal) warts: Secondary | ICD-10-CM | POA: Diagnosis not present

## 2021-04-16 DIAGNOSIS — D259 Leiomyoma of uterus, unspecified: Secondary | ICD-10-CM | POA: Diagnosis not present

## 2021-04-16 DIAGNOSIS — I10 Essential (primary) hypertension: Secondary | ICD-10-CM | POA: Insufficient documentation

## 2021-04-16 DIAGNOSIS — N920 Excessive and frequent menstruation with regular cycle: Secondary | ICD-10-CM | POA: Diagnosis not present

## 2021-04-16 LAB — BASIC METABOLIC PANEL
Anion gap: 6 (ref 5–15)
BUN: 11 mg/dL (ref 6–20)
CO2: 26 mmol/L (ref 22–32)
Calcium: 8.5 mg/dL — ABNORMAL LOW (ref 8.9–10.3)
Chloride: 103 mmol/L (ref 98–111)
Creatinine, Ser: 0.77 mg/dL (ref 0.44–1.00)
GFR, Estimated: >60 mL/min (ref >60)
Glucose, Bld: 100 mg/dL — ABNORMAL HIGH (ref 70–99)
Potassium: 3.2 mmol/L — ABNORMAL LOW (ref 3.5–5.1)
Sodium: 135 mmol/L (ref 135–145)

## 2021-04-16 LAB — CBC
HCT: 40.8 % (ref 36.0–46.0)
Hemoglobin: 13.4 g/dL (ref 12.0–15.0)
MCH: 30.4 pg (ref 26.0–34.0)
MCHC: 32.8 g/dL (ref 30.0–36.0)
MCV: 92.5 fL (ref 80.0–100.0)
Platelets: 245 10*3/uL (ref 150–400)
RBC: 4.41 MIL/uL (ref 3.87–5.11)
RDW: 13.3 % (ref 11.5–15.5)
WBC: 3 10*3/uL — ABNORMAL LOW (ref 4.0–10.5)
nRBC: 0 % (ref 0.0–0.2)

## 2021-04-16 LAB — TYPE AND SCREEN
ABO/RH(D): B POS
Antibody Screen: NEGATIVE

## 2021-04-17 ENCOUNTER — Other Ambulatory Visit: Payer: Self-pay

## 2021-04-17 ENCOUNTER — Ambulatory Visit: Payer: BC Managed Care – PPO | Admitting: Certified Registered Nurse Anesthetist

## 2021-04-17 ENCOUNTER — Other Ambulatory Visit: Payer: Self-pay | Admitting: Obstetrics and Gynecology

## 2021-04-17 ENCOUNTER — Encounter
Admission: RE | Disposition: A | Discharge status: Home / Self Care | Payer: Self-pay | Source: Home / Self Care | Attending: Obstetrics and Gynecology

## 2021-04-17 ENCOUNTER — Encounter: Payer: Self-pay | Admitting: Obstetrics and Gynecology

## 2021-04-17 ENCOUNTER — Ambulatory Visit
Admission: RE | Admit: 2021-04-17 | Discharge: 2021-04-17 | Disposition: A | Discharge status: Home / Self Care | Payer: BC Managed Care – PPO | Attending: Obstetrics and Gynecology | Admitting: Obstetrics and Gynecology

## 2021-04-17 DIAGNOSIS — B379 Candidiasis, unspecified: Secondary | ICD-10-CM | POA: Diagnosis not present

## 2021-04-17 DIAGNOSIS — R87612 Low grade squamous intraepithelial lesion on cytologic smear of cervix (LGSIL): Secondary | ICD-10-CM

## 2021-04-17 DIAGNOSIS — A630 Anogenital (venereal) warts: Secondary | ICD-10-CM

## 2021-04-17 DIAGNOSIS — I10 Essential (primary) hypertension: Secondary | ICD-10-CM | POA: Insufficient documentation

## 2021-04-17 DIAGNOSIS — N920 Excessive and frequent menstruation with regular cycle: Secondary | ICD-10-CM | POA: Insufficient documentation

## 2021-04-17 DIAGNOSIS — D259 Leiomyoma of uterus, unspecified: Secondary | ICD-10-CM | POA: Diagnosis not present

## 2021-04-17 HISTORY — PX: COLPOSCOPY: SHX161

## 2021-04-17 LAB — POCT PREGNANCY, URINE: Preg Test, Ur: NEGATIVE

## 2021-04-17 SURGERY — COLPOSCOPY
Anesthesia: General

## 2021-04-17 MED ORDER — KETOROLAC TROMETHAMINE 30 MG/ML IJ SOLN
INTRAMUSCULAR | Status: DC | PRN
Start: 2021-04-17 — End: 2021-04-17
  Administered 2021-04-17: 30 mg via INTRAVENOUS

## 2021-04-17 MED ORDER — IODINE STRONG (LUGOLS) 5 % PO SOLN
ORAL | Status: AC
  Filled 2021-04-17: qty 1

## 2021-04-17 MED ORDER — PROPOFOL 10 MG/ML IV BOLUS
INTRAVENOUS | Status: AC
  Filled 2021-04-17: qty 20

## 2021-04-17 MED ORDER — FENTANYL CITRATE (PF) 100 MCG/2ML IJ SOLN: 25.0000 ug | INTRAMUSCULAR | Status: DC | PRN

## 2021-04-17 MED ORDER — CHLORHEXIDINE GLUCONATE 0.12 % MT SOLN
OROMUCOSAL | Status: AC
  Filled 2021-04-17: qty 15

## 2021-04-17 MED ORDER — FLUCONAZOLE 150 MG PO TABS: 150.0000 mg | ORAL_TABLET | ORAL | 3 refills | Status: AC

## 2021-04-17 MED ORDER — CHLORHEXIDINE GLUCONATE 0.12 % MT SOLN: 15.0000 mL | Freq: Once | OROMUCOSAL | Status: AC

## 2021-04-17 MED ORDER — ROCURONIUM BROMIDE 10 MG/ML (PF) SYRINGE
PREFILLED_SYRINGE | INTRAVENOUS | Status: AC
  Filled 2021-04-17: qty 10

## 2021-04-17 MED ORDER — FAMOTIDINE 20 MG PO TABS: 20.0000 mg | ORAL_TABLET | Freq: Once | ORAL | Status: AC

## 2021-04-17 MED ORDER — LACTATED RINGERS IV SOLN: INTRAVENOUS | Status: DC

## 2021-04-17 MED ORDER — ONDANSETRON HCL 4 MG/2ML IJ SOLN
INTRAMUSCULAR | Status: DC | PRN
  Administered 2021-04-17: 4 mg via INTRAVENOUS

## 2021-04-17 MED ORDER — ONDANSETRON HCL 4 MG/2ML IJ SOLN
INTRAMUSCULAR | Status: AC
  Filled 2021-04-17: qty 2

## 2021-04-17 MED ORDER — FERRIC SUBSULFATE 259 MG/GM EX SOLN
CUTANEOUS | Status: AC
  Filled 2021-04-17: qty 8

## 2021-04-17 MED ORDER — FENTANYL CITRATE (PF) 100 MCG/2ML IJ SOLN
INTRAMUSCULAR | Status: DC | PRN
  Administered 2021-04-17: 50 ug via INTRAVENOUS
  Administered 2021-04-17 (×2): 25 ug via INTRAVENOUS

## 2021-04-17 MED ORDER — FAMOTIDINE 20 MG PO TABS
ORAL_TABLET | ORAL | Status: AC
  Administered 2021-04-17: 20 mg via ORAL
  Filled 2021-04-17: qty 1

## 2021-04-17 MED ORDER — MIDAZOLAM HCL 2 MG/2ML IJ SOLN
INTRAMUSCULAR | Status: DC | PRN
Start: 2021-04-17 — End: 2021-04-17
  Administered 2021-04-17: 2 mg via INTRAVENOUS

## 2021-04-17 MED ORDER — PROPOFOL 10 MG/ML IV BOLUS
INTRAVENOUS | Status: DC | PRN
  Administered 2021-04-17: 170 mg via INTRAVENOUS
  Administered 2021-04-17: 30 mg via INTRAVENOUS
  Administered 2021-04-17: 100 mg via INTRAVENOUS

## 2021-04-17 MED ORDER — OXYCODONE HCL 5 MG PO TABS: 5.0000 mg | ORAL_TABLET | Freq: Once | ORAL | Status: DC | PRN

## 2021-04-17 MED ORDER — LABETALOL HCL 5 MG/ML IV SOLN
INTRAVENOUS | Status: AC
  Filled 2021-04-17: qty 4

## 2021-04-17 MED ORDER — LIDOCAINE HCL (PF) 2 % IJ SOLN
INTRAMUSCULAR | Status: AC
  Filled 2021-04-17: qty 5

## 2021-04-17 MED ORDER — OXYCODONE HCL 5 MG/5ML PO SOLN: 5.0000 mg | Freq: Once | ORAL | Status: DC | PRN

## 2021-04-17 MED ORDER — DEXAMETHASONE SODIUM PHOSPHATE 10 MG/ML IJ SOLN
INTRAMUSCULAR | Status: DC | PRN
  Administered 2021-04-17: 10 mg via INTRAVENOUS

## 2021-04-17 MED ORDER — DEXAMETHASONE SODIUM PHOSPHATE 10 MG/ML IJ SOLN
INTRAMUSCULAR | Status: AC
  Filled 2021-04-17: qty 1

## 2021-04-17 MED ORDER — ACETIC ACID 3 % SOLN
Status: AC
Start: 2021-04-17 — End: ?
  Filled 2021-04-17: qty 500

## 2021-04-17 MED ORDER — KETOROLAC TROMETHAMINE 30 MG/ML IJ SOLN
INTRAMUSCULAR | Status: AC
  Filled 2021-04-17: qty 1

## 2021-04-17 MED ORDER — FENTANYL CITRATE (PF) 100 MCG/2ML IJ SOLN
INTRAMUSCULAR | Status: AC
  Filled 2021-04-17: qty 2

## 2021-04-17 MED ORDER — FERRIC SUBSULFATE 259 MG/GM EX SOLN
CUTANEOUS | Status: DC | PRN
  Administered 2021-04-17: 1 via TOPICAL

## 2021-04-17 MED ORDER — POVIDONE-IODINE 10 % EX SWAB: 2.0000 "application " | Freq: Once | CUTANEOUS | Status: DC

## 2021-04-17 MED ORDER — LABETALOL HCL 5 MG/ML IV SOLN
10.0000 mg | Freq: Once | INTRAVENOUS | Status: AC
  Administered 2021-04-17: 10 mg via INTRAVENOUS

## 2021-04-17 MED ORDER — MIDAZOLAM HCL 2 MG/2ML IJ SOLN
INTRAMUSCULAR | Status: AC
  Filled 2021-04-17: qty 2

## 2021-04-17 MED ORDER — CHLORHEXIDINE GLUCONATE 0.12 % MT SOLN
OROMUCOSAL | Status: AC
Start: 2021-04-17 — End: 2021-04-17
  Administered 2021-04-17: 15 mL via OROMUCOSAL
  Filled 2021-04-17: qty 15

## 2021-04-17 MED ORDER — FAMOTIDINE 20 MG PO TABS
ORAL_TABLET | ORAL | Status: AC
  Filled 2021-04-17: qty 1

## 2021-04-17 MED ORDER — ORAL CARE MOUTH RINSE: 15.0000 mL | Freq: Once | OROMUCOSAL | Status: AC

## 2021-04-17 MED ORDER — IODINE STRONG (LUGOLS) 5 % PO SOLN
ORAL | Status: DC | PRN
  Administered 2021-04-17: 2 mL

## 2021-04-17 MED ORDER — ACETIC ACID 3 % SOLN
Status: DC | PRN
  Administered 2021-04-17: 1 via TOPICAL

## 2021-04-17 SURGICAL SUPPLY — 41 items
APL SWBSTK 6 STRL LF DISP (MISCELLANEOUS) ×1
APPLICATOR COTTON TIP 6 STRL (MISCELLANEOUS) ×1 IMPLANT
APPLICATOR COTTON TIP 6IN STRL (MISCELLANEOUS) ×3 IMPLANT
APPLICATOR SWAB PROCTO LG 16IN (MISCELLANEOUS) ×3 IMPLANT
BACTOSHIELD CHG 4% 4OZ (MISCELLANEOUS) ×2
BLADE SURG SZ11 CARB STEEL (BLADE) ×6 IMPLANT
CATH ROBINSON RED A/P 16FR (CATHETERS) ×3 IMPLANT
CNTNR SPEC 2.5X3XGRAD LEK (MISCELLANEOUS) ×1
CONT SPEC 4OZ STER OR WHT (MISCELLANEOUS) ×2
CONT SPEC 4OZ STRL OR WHT (MISCELLANEOUS) ×1
CONTAINER SPEC 2.5X3XGRAD LEK (MISCELLANEOUS) ×1 IMPLANT
DRAPE PERI LITHO V/GYN (MISCELLANEOUS) ×3 IMPLANT
DRAPE UNDER BUTTOCK W/FLU (DRAPES) ×3 IMPLANT
DRSG TELFA 3X8 NADH (GAUZE/BANDAGES/DRESSINGS) ×3 IMPLANT
ELECT LEEP BALL 5MM 12CM (MISCELLANEOUS) ×3
ELECT REM PT RETURN 9FT ADLT (ELECTROSURGICAL) ×3
ELECTRODE LEEP BALL 5MM 12CM (MISCELLANEOUS) ×1 IMPLANT
ELECTRODE REM PT RTRN 9FT ADLT (ELECTROSURGICAL) ×1 IMPLANT
GAUZE 4X4 16PLY ~~LOC~~+RFID DBL (SPONGE) ×6 IMPLANT
GLOVE SURG SYN 6.5 ES PF (GLOVE) ×6 IMPLANT
GLOVE SURG SYN 6.5 PF PI (GLOVE) ×2 IMPLANT
GLOVE SURG UNDER POLY LF SZ6.5 (GLOVE) ×6 IMPLANT
GOWN STRL REUS W/ TWL LRG LVL3 (GOWN DISPOSABLE) ×2 IMPLANT
GOWN STRL REUS W/TWL LRG LVL3 (GOWN DISPOSABLE) ×6
KIT TURNOVER CYSTO (KITS) ×3 IMPLANT
MANIFOLD NEPTUNE II (INSTRUMENTS) ×3 IMPLANT
NDL SPNL 22GX3.5 QUINCKE BK (NEEDLE) ×1 IMPLANT
NEEDLE SPNL 22GX3.5 QUINCKE BK (NEEDLE) ×3 IMPLANT
NS IRRIG 500ML POUR BTL (IV SOLUTION) ×3 IMPLANT
PACK BASIN MINOR ARMC (MISCELLANEOUS) ×3 IMPLANT
PAD DRESSING TELFA 3X8 NADH (GAUZE/BANDAGES/DRESSINGS) ×1 IMPLANT
PAD OB MATERNITY 4.3X12.25 (PERSONAL CARE ITEMS) ×3 IMPLANT
PAD PREP 24X41 OB/GYN DISP (PERSONAL CARE ITEMS) ×3 IMPLANT
SCRUB CHG 4% DYNA-HEX 4OZ (MISCELLANEOUS) ×1 IMPLANT
SUT SILK 2 0 SH (SUTURE) ×3 IMPLANT
SUT VIC AB 0 CT1 36 (SUTURE) ×12 IMPLANT
SUT VIC AB 2-0 CT1 27 (SUTURE) ×6
SUT VIC AB 2-0 CT1 TAPERPNT 27 (SUTURE) ×2 IMPLANT
SYR 10ML LL (SYRINGE) ×3 IMPLANT
SYR CONTROL 10ML LL (SYRINGE) ×3 IMPLANT
WATER STERILE IRR 500ML POUR (IV SOLUTION) ×3 IMPLANT

## 2021-04-17 NOTE — Discharge Instructions (Addendum)
After your colposcopy you can use over the counter ibuprofen and tylenol as needed for pain.  ? ?You may have black or brownish discharge for 3-7 days.  ? ?Refrain from tampon usage and intercourse for 3 days after the colposcopy.  ? ?You had a vaginal yeast infection. Please take Diflucan as prescribed to treat this infection.  ? 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  You will need to see general surgery for to treatment and to prevent these lesions from worsening. I will refer you to a general surgeon ? ? ?AMBULATORY SURGERY  ?DISCHARGE INSTRUCTIONS ? ? ?The drugs that you were given will stay in your system until tomorrow so for the next 24 hours you should not: ? ?Drive an automobile ?Make any legal decisions ?Drink any alcoholic beverage ? ? ?You may resume regular meals tomorrow.  Today it is better to start with liquids and gradually work up to solid foods. ? ?You may eat anything you prefer, but it is better to start with liquids, then soup and crackers, and gradually work up to solid foods. ? ? ?Please notify your doctor immediately if you have any unusual bleeding, trouble breathing, redness and pain at the surgery site, drainage, fever, or pain not relieved by medication. ? ? ? ?Additional Instructions: ? ? ? ?Please contact your physician with any problems or Same Day Surgery at [phone], Monday through Friday 6 am to 4 pm, or [place] at [facility] number at [phone].  ?

## 2021-04-17 NOTE — Anesthesia Postprocedure Evaluation (Signed)
Anesthesia Post Note ? ?Patient: Lisa Knox ? ?Procedure(s) Performed: COLPOSCOPY WITH CERVICAL BIOPSY AND CURETTAGE ? ?Patient location during evaluation: PACU ?Anesthesia Type: General ?Level of consciousness: awake and alert ?Pain management: pain level controlled ?Vital Signs Assessment: post-procedure vital signs reviewed and stable ?Respiratory status: spontaneous breathing, nonlabored ventilation, respiratory function stable and patient connected to nasal cannula oxygen ?Cardiovascular status: blood pressure returned to baseline and stable ?Postop Assessment: no apparent nausea or vomiting ?Anesthetic complications: no ? ? ?No notable events documented. ? ? ?Last Vitals:  ?Vitals:  ? 04/17/21 1443 04/17/21 1453  ?BP: (!) 143/98 (!) 155/107  ?Pulse: 77 82  ?Resp: 16 18  ?Temp: 36.7 ?C 37.2 ?C  ?SpO2: 100% 100%  ?  ?Last Pain:  ?Vitals:  ? 04/17/21 1453  ?TempSrc: Temporal  ?PainSc: 0-No pain  ? ? ?  ?  ?  ?  ?  ?  ? ?Precious Haws Seamus Warehime ? ? ? ? ?

## 2021-04-17 NOTE — H&P (Signed)
Lisa Knox is an 41 y.o. female.   ?Chief Complaint: LSIL pap smear ?HPI: She is here today for a colposcopy after LEIL pap smear. Patient has an enlarged fibroid uterus which made performing the colposcopy in the office not technically feasible ? ?Past Medical History:  ?Diagnosis Date  ? Anal fissure 02/12/1999  ? Anemia   ? Depression   ? Headache   ? H/O  ? Hypertension   ? ? ?Past Surgical History:  ?Procedure Laterality Date  ? BREAST SURGERY    ? biopsy  ? SIGMOIDOSCOPY  02/12/1999  ? WISDOM TOOTH EXTRACTION  02/12/2000  ? ? ?Family History  ?Problem Relation Age of Onset  ? Hypertension Mother   ? GER disease Mother   ? Colon polyps Father   ? Gallbladder disease Father   ? Crohn's disease Father   ? Hypertension Father   ? Hypertension Brother   ? Congestive Heart Failure Maternal Grandmother   ? Diabetes Paternal Grandmother   ? Dementia Paternal Grandfather   ? Thyroid disease Brother   ? Breast cancer Maternal Aunt   ? ?Social History:  reports that she has never smoked. She has never used smokeless tobacco. She reports current alcohol use. She reports that she does not use drugs. ? ?Allergies:  ?Allergies  ?Allergen Reactions  ? Pollen Extract Other (See Comments)  ?  Pet dander- Hives  ? Shellfish Allergy Hives  ? ? ?Medications Prior to Admission  ?Medication Sig Dispense Refill  ? diphenhydrAMINE (BENADRYL) 25 MG tablet Take 25 mg by mouth at bedtime as needed for allergies.    ? ferrous sulfate 325 (65 FE) MG tablet Take 325 mg by mouth in the morning and at bedtime.    ? fluticasone (FLONASE) 50 MCG/ACT nasal spray Place 2 sprays into both nostrils daily. (Patient taking differently: Place 2 sprays into both nostrils daily as needed for allergies.) 48 g 3  ? ibuprofen (ADVIL) 200 MG tablet Take 200 mg by mouth every 6 (six) hours as needed.    ? lamoTRIgine (LAMICTAL) 100 MG tablet Take 1 tablet (100 mg total) by mouth at bedtime. 60 tablet 5  ? Multiple Vitamin (MULTIVITAMIN) tablet Take 1  tablet by mouth daily.    ? ? ?Results for orders placed or performed during the hospital encounter of 04/17/21 (from the past 48 hour(s))  ?Pregnancy, urine POC     Status: None  ? Collection Time: 04/17/21 11:36 AM  ?Result Value Ref Range  ? Preg Test, Ur NEGATIVE NEGATIVE  ?  Comment:        ?THE SENSITIVITY OF THIS ?METHODOLOGY IS >24 mIU/mL ?  ? ?No results found. ? ?Review of Systems  ?Constitutional:  Negative for chills and fever.  ?HENT:  Negative for congestion, hearing loss and sinus pain.   ?Respiratory:  Negative for cough, shortness of breath and wheezing.   ?Cardiovascular:  Negative for chest pain, palpitations and leg swelling.  ?Gastrointestinal:  Negative for abdominal pain, constipation, diarrhea, nausea and vomiting.  ?Genitourinary:  Negative for dysuria, flank pain, frequency, hematuria and urgency.  ?Musculoskeletal:  Negative for back pain.  ?Skin:  Negative for rash.  ?Neurological:  Negative for dizziness and headaches.  ?Psychiatric/Behavioral:  Negative for suicidal ideas. The patient is not nervous/anxious.   ? ?Blood pressure 118/75, pulse 82, temperature 98 ?F (36.7 ?C), resp. rate 13, height '5\' 6"'$  (1.676 m), weight 70.3 kg, last menstrual period 03/30/2021, SpO2 100 %. ?Physical Exam ?Vitals and nursing note  reviewed.  ?Constitutional:   ?   Appearance: Normal appearance. She is well-developed.  ?HENT:  ?   Head: Normocephalic and atraumatic.  ?Cardiovascular:  ?   Rate and Rhythm: Normal rate and regular rhythm.  ?Pulmonary:  ?   Effort: Pulmonary effort is normal.  ?   Breath sounds: Normal breath sounds.  ?Abdominal:  ?   General: Bowel sounds are normal.  ?   Palpations: Abdomen is soft.  ?Musculoskeletal:     ?   General: Normal range of motion.  ?Skin: ?   General: Skin is warm and dry.  ?Neurological:  ?   Mental Status: She is alert and oriented to person, place, and time.  ?Psychiatric:     ?   Behavior: Behavior normal.     ?   Thought Content: Thought content normal.      ?   Judgment: Judgment normal.  ?  ? ?Assessment/Plan ?41 yo with LSIL pap smear, her today for colposcopy ?Will proceed with procedure as planned. All questions answered.  ? ?Homero Fellers, MD ?04/17/2021, 2:14 PM ? ? ? ? ?

## 2021-04-17 NOTE — Anesthesia Preprocedure Evaluation (Signed)
Anesthesia Evaluation  ?Patient identified by MRN, date of birth, ID band ?Patient awake ? ? ? ?Reviewed: ?Allergy & Precautions, NPO status , Patient's Chart, lab work & pertinent test results ? ?History of Anesthesia Complications ?Negative for: history of anesthetic complications ? ?Airway ?Mallampati: II ? ?TM Distance: >3 FB ?Neck ROM: full ? ? ? Dental ? ?(+) Chipped ?  ?Pulmonary ?neg pulmonary ROS, neg shortness of breath,  ?  ?Pulmonary exam normal ? ? ? ? ? ? ? Cardiovascular ?Exercise Tolerance: Good ?hypertension, (-) angina(-) Past MI Normal cardiovascular exam ? ? ?  ?Neuro/Psych ? Headaches, PSYCHIATRIC DISORDERS   ? GI/Hepatic ?negative GI ROS, Neg liver ROS, neg GERD  ,  ?Endo/Other  ?negative endocrine ROS ? Renal/GU ?  ? ?  ?Musculoskeletal ? ? Abdominal ?  ?Peds ? Hematology ?negative hematology ROS ?(+)   ?Anesthesia Other Findings ?Past Medical History: ?02/12/1999: Anal fissure ?No date: Anemia ?No date: Depression ?No date: Headache ?    Comment:  H/O ?No date: Hypertension ? ?Past Surgical History: ?No date: BREAST SURGERY ?    Comment:  biopsy ?02/12/1999: SIGMOIDOSCOPY ?02/12/2000: WISDOM TOOTH EXTRACTION ? ?BMI   ? Body Mass Index: 25.02 kg/m?  ?  ? ? Reproductive/Obstetrics ?negative OB ROS ? ?  ? ? ? ? ? ? ? ? ? ? ? ? ? ?  ?  ? ? ? ? ? ? ? ? ?Anesthesia Physical ?Anesthesia Plan ? ?ASA: 2 ? ?Anesthesia Plan: General LMA  ? ?Post-op Pain Management:   ? ?Induction: Intravenous ? ?PONV Risk Score and Plan: Dexamethasone, Ondansetron, Midazolam and Treatment may vary due to age or medical condition ? ?Airway Management Planned: LMA ? ?Additional Equipment:  ? ?Intra-op Plan:  ? ?Post-operative Plan: Extubation in OR ? ?Informed Consent: I have reviewed the patients History and Physical, chart, labs and discussed the procedure including the risks, benefits and alternatives for the proposed anesthesia with the patient or authorized representative who has  indicated his/her understanding and acceptance.  ? ? ? ?Dental Advisory Given ? ?Plan Discussed with: Anesthesiologist, CRNA and Surgeon ? ?Anesthesia Plan Comments: (Patient consented for risks of anesthesia including but not limited to:  ?- adverse reactions to medications ?- damage to eyes, teeth, lips or other oral mucosa ?- nerve damage due to positioning  ?- sore throat or hoarseness ?- Damage to heart, brain, nerves, lungs, other parts of body or loss of life ? ?Patient voiced understanding.)  ? ? ? ? ? ? ?Anesthesia Quick Evaluation ? ?

## 2021-04-17 NOTE — Anesthesia Procedure Notes (Signed)
Procedure Name: LMA Insertion ?Date/Time: 04/17/2021 1:20 PM ?Performed by: Demetrius Charity, CRNA ?Pre-anesthesia Checklist: Patient identified, Patient being monitored, Timeout performed, Emergency Drugs available and Suction available ?Patient Re-evaluated:Patient Re-evaluated prior to induction ?Oxygen Delivery Method: Circle system utilized ?Preoxygenation: Pre-oxygenation with 100% oxygen ?Induction Type: IV induction ?Ventilation: Mask ventilation without difficulty ?LMA: LMA inserted ?LMA Size: 3.0 ?Tube type: Oral ?Number of attempts: 1 ?Placement Confirmation: positive ETCO2 and breath sounds checked- equal and bilateral ?Tube secured with: Tape ?Dental Injury: Teeth and Oropharynx as per pre-operative assessment  ? ? ? ? ?

## 2021-04-17 NOTE — Progress Notes (Signed)
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

## 2021-04-17 NOTE — Op Note (Signed)
Operative Note  ?04/17/2021 ? ?PRE-OP DIAGNOSIS: LSIL pap smear ? ?POST-OP DIAGNOSIS:  1. LSIL pap smear ?    2. Yeast infection ?    3. Anal condylomas ? ?SURGEON: Adrian Prows MD ? ?PROCEDURE: Procedure(s): ?COLPOSCOPY  ? ?ANESTHESIA: Choice  ? ?ESTIMATED BLOOD LOSS: 1 cc ?  ?SPECIMENS:  12 o'clock ectocervical biopsy. 6 o'clock ectocervical biopsy. Endocervical curettage    ? ?COMPLICATIONS: None ? ?DISPOSITION: PACU - hemodynamically stable. ? ?CONDITION: stable ? ?FINDINGS: Exam under anesthesia revealed  enlarged fibroid  uterus with bilateral adnexa without masses or fullness. Colposcopy showed a normal cervix. No highlighting with acetic acid or lugol solution. 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 Clumpy white vaginal discharge consistent with yeast infection.  ? ?PROCEDURE IN DETAIL: After informed consent was obtained, the patient was taken to the operating room where anesthesia was obtained without difficulty. The patient was positioned in the dorsal lithotomy position in [place]. The patient's bladder was catheterized with an in and out foley catheter. The patient was examined under anesthesia, with the above noted findings. The weighted speculum was placed inside the patient's vagina, and the the anterior lip of the cervix was seen and grasped with the tenaculum.  Colposcopy was performed with acetic acid and lugols solution. Ectocervical and endocervical biopsies were performed. Silver nitrate and Astrin-gyn was applied for excellent hemostasis.  All instruments were removed from the vagina.  She was then taken out of dorsal lithotomy. The patient tolerated the procedure well. Sponge, lap and needle counts were correct x2. The patient was taken to recovery room in excellent condition. ? ?Adrian Prows MD ?[place], [place] ?04/17/2021 ?2:04 PM ? ?

## 2021-04-17 NOTE — Transfer of Care (Signed)
Immediate Anesthesia Transfer of Care Note ? ?Patient: Lisa Knox ? ?Procedure(s) Performed: COLPOSCOPY ? ?Patient Location: PACU ? ?Anesthesia Type:General ? ?Level of Consciousness: drowsy ? ?Airway & Oxygen Therapy: Patient Spontanous Breathing and Patient connected to face mask oxygen ? ?Post-op Assessment: Report given to RN and Post -op Vital signs reviewed and stable ? ?Post vital signs: Reviewed and stable ? ?Last Vitals:  ?Vitals Value Taken Time  ?BP 118/75 04/17/21 1358  ?Temp    ?Pulse 82 04/17/21 1400  ?Resp 13 04/17/21 1400  ?SpO2 100 % 04/17/21 1400  ?Vitals shown include unvalidated device data. ? ?Last Pain:  ?Vitals:  ? 04/17/21 1150  ?TempSrc: Temporal  ?PainSc: 0-No pain  ?   ? ?  ? ?Complications: No notable events documented. ?

## 2021-04-18 ENCOUNTER — Encounter: Payer: Self-pay | Admitting: Obstetrics and Gynecology

## 2021-04-18 LAB — SURGICAL PATHOLOGY

## 2021-04-25 ENCOUNTER — Encounter: Payer: Self-pay | Admitting: Obstetrics and Gynecology

## 2021-04-25 ENCOUNTER — Ambulatory Visit (INDEPENDENT_AMBULATORY_CARE_PROVIDER_SITE_OTHER): Payer: BC Managed Care – PPO | Admitting: Obstetrics and Gynecology

## 2021-04-25 ENCOUNTER — Other Ambulatory Visit: Payer: Self-pay

## 2021-04-25 VITALS — BP 130/80 | Ht 66.0 in | Wt 157.0 lb

## 2021-04-25 DIAGNOSIS — Z4889 Encounter for other specified surgical aftercare: Secondary | ICD-10-CM

## 2021-04-25 NOTE — Progress Notes (Signed)
?  Postoperative Follow-up ?Patient presents post op from  colposcopy  for  LSIL pap smear and difficult to see cervix in the office , 1 week ago. ? ?Subjective: ?She reports that since the surgery Se has been feeling well. She has no complaints.  ?Activity: normal activities of daily living.  ? ?Objective: ?BP 130/80   Ht '5\' 6"'$  (1.676 m)   Wt 157 lb (71.2 kg)   LMP 03/30/2021   BMI 25.34 kg/m?  ? ?Physical Exam ?Constitutional:   ?   Appearance: Normal appearance. She is well-developed.  ?HENT:  ?   Head: Normocephalic and atraumatic.  ?Eyes:  ?   Extraocular Movements: Extraocular movements intact.  ?   Pupils: Pupils are equal, round, and reactive to light.  ?Neck:  ?   Thyroid: No thyromegaly.  ?Cardiovascular:  ?   Rate and Rhythm: Normal rate and regular rhythm.  ?   Heart sounds: Normal heart sounds.  ?Pulmonary:  ?   Effort: Pulmonary effort is normal.  ?   Breath sounds: Normal breath sounds.  ?Abdominal:  ?   General: Bowel sounds are normal. There is no distension.  ?   Palpations: Abdomen is soft. There is no mass.  ?Musculoskeletal:  ?   Cervical back: Neck supple.  ?Neurological:  ?   Mental Status: She is alert and oriented to person, place, and time.  ?Skin: ?   General: Skin is warm and dry.  ?Psychiatric:     ?   Behavior: Behavior normal.     ?   Thought Content: Thought content normal.     ?   Judgment: Judgment normal.  ?Vitals reviewed.  ? ? ?Assessment: ?s/p :  Colposcopy stable ? ?Plan: ? ?Patient has done well after surgery with no apparent complications.  ? 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 She has a plan to follow uip with general surgery later this week.  ?She is still considering management of her fibroid uterus but will follow up with another GYN about this issue.  ? ?I have discussed the post-operative course to date, and the expected progress moving forward.  The patient understands what complications to be concerned  about.  I will see the patient in routine follow up, or sooner if needed.   ? ?Activity plan: No restriction.   ? ?Adrian Prows MD, ?[facility], [facility] ?04/25/2021 ?10:48 AM ? ? ? ?

## 2021-04-26 ENCOUNTER — Encounter: Payer: Self-pay | Admitting: Surgery

## 2021-04-26 ENCOUNTER — Ambulatory Visit: Payer: BC Managed Care – PPO | Admitting: Surgery

## 2021-04-26 VITALS — BP 149/99 | HR 103 | Temp 98.7°F | Ht 66.0 in | Wt 155.4 lb

## 2021-04-26 DIAGNOSIS — A630 Anogenital (venereal) warts: Secondary | ICD-10-CM | POA: Diagnosis not present

## 2021-04-26 NOTE — Progress Notes (Signed)
Patient ID: Lisa Knox, female   DOB: January 06, 1981, 41 y.o.   MRN: [id number] ? ?Chief Complaint:  Anal condyloma  ? ?History of Present Illness ?Lisa Knox is a 41 y.o. female with known anal condyloma for approximately 2 years.  Reports normal bowel activity, denies any particular issues.  Referred by her gynecologist.  She denies any pain, itching or tenderness. ? ?Past Medical History ?Past Medical History:  ?Diagnosis Date  ? Anal fissure 02/12/1999  ? Anemia   ? Depression   ? Headache   ? H/O  ? Hypertension   ?  ? ? ?Past Surgical History:  ?Procedure Laterality Date  ? BREAST SURGERY    ? biopsy  ? COLPOSCOPY N/A 04/17/2021  ? Procedure: COLPOSCOPY WITH CERVICAL BIOPSY AND CURETTAGE;  Surgeon: Homero Fellers, MD;  Location: [facility] ORS;  Service: Gynecology;  Laterality: N/A;  ? SIGMOIDOSCOPY  02/12/1999  ? WISDOM TOOTH EXTRACTION  02/12/2000  ? ? ?Allergies  ?Allergen Reactions  ? Pollen Extract Other (See Comments)  ?  Pet dander- Hives  ? Shellfish Allergy Hives  ? ? ?Current Outpatient Medications  ?Medication Sig Dispense Refill  ? diphenhydrAMINE (BENADRYL) 25 MG tablet Take 25 mg by mouth at bedtime as needed for allergies.    ? ferrous sulfate 325 (65 FE) MG tablet Take 325 mg by mouth in the morning and at bedtime.    ? fluticasone (FLONASE) 50 MCG/ACT nasal spray Place 2 sprays into both nostrils daily. (Patient taking differently: Place 2 sprays into both nostrils daily as needed for allergies.) 48 g 3  ? ibuprofen (ADVIL) 200 MG tablet Take 200 mg by mouth every 6 (six) hours as needed.    ? lamoTRIgine (LAMICTAL) 100 MG tablet Take 1 tablet (100 mg total) by mouth at bedtime. 60 tablet 5  ? Multiple Vitamin (MULTIVITAMIN) tablet Take 1 tablet by mouth daily.    ? ?No current facility-administered medications for this visit.  ? ? ?Family History ?Family History  ?Problem Relation Age of Onset  ? Hypertension Mother   ? GER disease Mother   ? Colon polyps Father   ? Gallbladder disease Father    ? Crohn's disease Father   ? Hypertension Father   ? Hypertension Brother   ? Congestive Heart Failure Maternal Grandmother   ? Diabetes Paternal Grandmother   ? Dementia Paternal Grandfather   ? Thyroid disease Brother   ? Breast cancer Maternal Aunt   ?  ? ? ?Social History ?Social History  ? ?Tobacco Use  ? Smoking status: Never  ? Smokeless tobacco: Never  ?Vaping Use  ? Vaping Use: Never used  ?Substance Use Topics  ? Alcohol use: Yes  ?  Alcohol/week: 0.0 standard drinks  ?  Comment: OCCASIONALLY  ? Drug use: No  ?  ?  ? ? ?Review of Systems  ?Constitutional:  Positive for diaphoresis.  ?HENT:  Positive for congestion.   ?Eyes: Negative.   ?Respiratory:  Positive for cough.   ?Cardiovascular: Negative.   ?Gastrointestinal:  Positive for blood in stool.  ?Genitourinary: Negative.   ?Skin: Negative.   ?Neurological:  Positive for headaches.  ?Psychiatric/Behavioral: Negative.    ?  ? ?Physical Exam ?Last menstrual period 03/30/2021. ? ? ?CONSTITUTIONAL: Well developed, and nourished, appropriately responsive and aware without distress.   ?EYES: Sclera non-icteric.   ?EARS, NOSE, MOUTH AND THROAT: Mask worn.   The oropharynx is clear. Oral mucosa is pink and moist.   Hearing is intact to  voice.  ?NECK: Trachea is midline, and there is no jugular venous distension.  ?LYMPH NODES:  Lymph nodes in the neck are not enlarged. ?RESPIRATORY:  Lungs are clear, and breath sounds are equal bilaterally. Normal respiratory effort without pathologic use of accessory muscles. ?CARDIOVASCULAR: Heart is regular in rate and rhythm. ?GI: The abdomen is  soft, nontender, and nondistended. There were no palpable masses.  ?GU: Chaperone: Angela.   External hemorrhoidal tags with associated fronds c/w condyloma.  DRE: with intra-luminal lesions c/w same.  ?MUSCULOSKELETAL:  Symmetrical muscle tone appreciated in all four extremities.    ?SKIN: Skin turgor is normal. No pathologic skin lesions appreciated.  ?NEUROLOGIC:  Motor and  sensation appear grossly normal.  Cranial nerves are grossly without defect. ?PSYCH:  Alert and oriented to person, place and time. Affect is appropriate for situation. ? ?Data Reviewed ?I have personally reviewed what is currently available of the patient's imaging, recent labs and medical records.   ?Labs:  ?CBC Latest Ref Rng & Units 04/16/2021 01/11/2021 12/11/2020  ?WBC 4.0 - 10.5 K/uL 3.0(L) 4.3 5.0  ?Hemoglobin 12.0 - 15.0 g/dL 13.4 11.7 7.4(L)  ?Hematocrit 36.0 - 46.0 % 40.8 36.6 26.1(L)  ?Platelets 150 - 400 K/uL 245 275 437  ? ?CMP Latest Ref Rng & Units 04/16/2021 12/11/2020 12/06/2019  ?Glucose 70 - 99 mg/dL 100(H) 90 84  ?BUN 6 - 20 mg/dL '11 10 12  '$ ?Creatinine 0.44 - 1.00 mg/dL 0.77 0.83 0.99  ?Sodium 135 - 145 mmol/L 135 137 138  ?Potassium 3.5 - 5.1 mmol/L 3.2(L) 4.1 3.9  ?Chloride 98 - 111 mmol/L 103 102 102  ?CO2 22 - 32 mmol/L '26 21 22  '$ ?Calcium 8.9 - 10.3 mg/dL 8.5(L) 8.8 9.1  ?Total Protein 6.0 - 8.5 g/dL - 8.5 8.4  ?Total Bilirubin 0.0 - 1.2 mg/dL - 0.7 0.7  ?Alkaline Phos 44 - 121 IU/L - 72 68  ?AST 0 - 40 IU/L - 21 11  ?ALT 0 - 32 IU/L - 24 12  ? ? ? ? ?Imaging: ? ?Within last 24 hrs: No results found. ? ?Assessment ?   ?Anal condyloma. ?Patient Active Problem List  ? Diagnosis Date Noted  ? LGSIL on Pap smear of cervix   ? Hypertension 12/06/2019  ? 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 Clinical depression 08/10/2014  ? Abnormal liver enzymes 08/10/2014  ? Herpes zona 08/10/2014  ? Headache, migraine 08/10/2014  ? ? ?Plan ?   ? ?The below was recommended, however the patient would desire to defer this until the end of her school year, we will revisit with an appointment mid to the end of May as she desires to anticipate surgery within the next month of June or even July. ? ?Rectal exam under anesthesia, excision of condylomatous lesions/anal lesions/hemorrhoidal tissue. ?Risks of the above procedure been discussed in detail which include but not limited to anesthesia, bleeding,  recurrence, anal stenosis or dysfunction, infection.  I believe she understands these risks are not all inclusive, and had her questions adequately answered.  She desires to proceed without any guarantees ever expressed or implied. ?She knows that we will intend a Fleet enema to be utilized the evening prior and the morning before surgery. ? ?Face-to-face time spent with the patient and accompanying care providers(if present) was 45 minutes, with more than 50% of the time spent counseling, educating, and coordinating care of the patient.   ? ?These notes generated with voice recognition software. I apologize for typographical errors. ? ?  Ronny Bacon M.D., FACS ?04/26/2021, 3:37 PM ? ? ? ? ?

## 2021-04-26 NOTE — Patient Instructions (Addendum)
If you have any concerns or questions, please feel free to call our office. See follow up appointment below. 

## 2021-05-02 ENCOUNTER — Telehealth: Payer: Self-pay | Admitting: Surgery

## 2021-05-02 NOTE — Telephone Encounter (Signed)
Outgoing call is made, left message for patient to call, please inform of the following regarding scheduled surgery:  ? ?Pre-Admission date/time, COVID Testing date and Surgery date. ? ?Surgery Date: 07/06/21 ?Preadmission Testing Date: 06/26/21 (phone 8a-1p) ?Covid Testing Date: Not needed.    ? ?Also patient will need to call at [phone], between 1-3:00pm the day before surgery, to find out what time to arrive for surgery.   ? ?

## 2021-05-11 NOTE — Telephone Encounter (Signed)
Outgoing call is made again, patient is now aware of all dates regarding her surgery and verbalized understanding.  

## 2021-06-26 ENCOUNTER — Ambulatory Visit: Payer: Self-pay | Admitting: Surgery

## 2021-06-26 ENCOUNTER — Encounter
Admission: RE | Admit: 2021-06-26 | Discharge: 2021-06-26 | Disposition: A | Discharge status: Home / Self Care | Payer: BC Managed Care – PPO | Source: Ambulatory Visit | Attending: Surgery | Admitting: Surgery

## 2021-06-26 VITALS — Ht 66.0 in | Wt 155.0 lb

## 2021-06-26 DIAGNOSIS — Z01812 Encounter for preprocedural laboratory examination: Secondary | ICD-10-CM

## 2021-06-26 DIAGNOSIS — A630 Anogenital (venereal) warts: Secondary | ICD-10-CM

## 2021-06-26 HISTORY — DX: Anogenital (venereal) warts: A63.0

## 2021-06-26 HISTORY — DX: Unspecified hemorrhoids: K64.9

## 2021-06-26 NOTE — Patient Instructions (Addendum)
Your procedure is scheduled on: Friday, May 26 ?Report to the Registration Desk on the 1st floor of the [facility]. ?To find out your arrival time, please call [phone] between 1PM - 3PM on: Thursday, May 25 ?If your arrival time is 6:00 am, do not arrive prior to that time as the [facility] entrance doors do not open until 6:00 am. ? ?REMEMBER: ?Instructions that are not followed completely may result in serious medical risk, up to and including death; or upon the discretion of your surgeon and anesthesiologist your surgery may need to be rescheduled. ? ?Do not eat or drink after midnight the night before surgery.  ?No gum chewing, lozengers or hard candies. ? ?DO NOT TAKE ANY MEDICATIONS THE MORNING OF SURGERY  ? ?One week prior to surgery: Starting May 19 ?Stop Anti-inflammatories (NSAIDS) such as Advil, Aleve, Ibuprofen, Motrin, Naproxen, Naprosyn and Aspirin based products such as Excedrin, Goodys Powder, BC Powder. ?Stop ANY OVER THE COUNTER supplements until after surgery. ?You may however, continue to take Tylenol if needed for pain up until the day of surgery. ? ?No Alcohol for 24 hours before or after surgery. ? ?No Smoking including e-cigarettes for 24 hours prior to surgery.  ?No chewable tobacco products for at least 6 hours prior to surgery.  ?No nicotine patches on the day of surgery. ? ?Do not use any "recreational" drugs for at least a week prior to your surgery.  ?Please be advised that the combination of cocaine and anesthesia may have negative outcomes, up to and including death. ?If you test positive for cocaine, your surgery will be cancelled. ? ?On the morning of surgery brush your teeth with toothpaste and water, you may rinse your mouth with mouthwash if you wish. ?Do not swallow any toothpaste or mouthwash. ? ?Do not wear jewelry, make-up, hairpins, clips or nail polish. ? ?Do not wear lotions, powders, or perfumes.  ? ?Do not shave body from the neck down 48 hours prior to  surgery just in case you cut yourself which could leave a site for infection.  ? ?Contact lenses, hearing aids and dentures may not be worn into surgery. ? ?Do not bring valuables to the hospital. [facility] is not responsible for any missing/lost belongings or valuables.  ? ?Fleets enema or bowel prep as directed. ? ?Notify your doctor if there is any change in your medical condition (cold, fever, infection). ? ?Wear comfortable clothing (specific to your surgery type) to the hospital. ? ?After surgery, you can help prevent lung complications by doing breathing exercises.  ?Take deep breaths and cough every 1-2 hours. Your doctor may order a device called an Incentive Spirometer to help you take deep breaths. ? ?If you are being discharged the day of surgery, you will not be allowed to drive home. ?You will need a responsible adult (18 years or older) to drive you home and stay with you that night.  ? ?If you are taking public transportation, you will need to have a responsible adult (18 years or older) with you. ?Please confirm with your physician that it is acceptable to use public transportation.  ? ?Please call the [facility] at [phone] if you have any questions about these instructions. ? ?Surgery Visitation Policy: ? ?Patients undergoing a surgery or procedure may have two family members or support persons with them as long as the person is not COVID-19 positive or experiencing its symptoms.  ?

## 2021-06-28 ENCOUNTER — Encounter: Payer: Self-pay | Admitting: Urgent Care

## 2021-06-28 ENCOUNTER — Other Ambulatory Visit: Payer: Self-pay | Admitting: Surgery

## 2021-06-28 ENCOUNTER — Encounter: Payer: Self-pay | Admitting: Surgery

## 2021-06-28 ENCOUNTER — Ambulatory Visit: Payer: BC Managed Care – PPO | Admitting: Surgery

## 2021-06-28 ENCOUNTER — Encounter
Admission: RE | Admit: 2021-06-28 | Discharge: 2021-06-28 | Disposition: A | Discharge status: Home / Self Care | Payer: BC Managed Care – PPO | Source: Ambulatory Visit | Attending: Surgery | Admitting: Surgery

## 2021-06-28 ENCOUNTER — Other Ambulatory Visit: Payer: Self-pay

## 2021-06-28 VITALS — BP 156/92 | HR 88 | Temp 99.1°F | Ht 66.0 in | Wt 165.2 lb

## 2021-06-28 DIAGNOSIS — A630 Anogenital (venereal) warts: Secondary | ICD-10-CM

## 2021-06-28 DIAGNOSIS — Z01812 Encounter for preprocedural laboratory examination: Secondary | ICD-10-CM | POA: Diagnosis present

## 2021-06-28 LAB — POTASSIUM: Potassium: 3.3 mmol/L — ABNORMAL LOW (ref 3.5–5.1)

## 2021-06-28 NOTE — Progress Notes (Signed)
Patient ID: Lisa Knox, female   DOB: 10/25/1980, 41 y.o.   MRN: [id number]  Chief Complaint:  Anal condyloma   History of Present Illness Lisa Knox is a 41 y.o. female with known anal condyloma for approximately 2 years.  Reports normal bowel activity, denies any particular issues.  Referred by her gynecologist.  She denies any pain, itching or tenderness.  She denies any changes in known warts, no remarkable bleeding, persistent lesions on BM and anal hygiene.   Past Medical History Past Medical History:  Diagnosis Date   Anal fissure 02/12/1999   Anemia    Condyloma    Depression    Headache    H/O   Hemorrhoids    Hypertension    never on medication; treated with weight loss and diet      Past Surgical History:  Procedure Laterality Date   BREAST BIOPSY Left    negative   COLPOSCOPY N/A 04/17/2021   Procedure: COLPOSCOPY WITH CERVICAL BIOPSY AND CURETTAGE;  Surgeon: Homero Fellers, MD;  Location: [facility] ORS;  Service: Gynecology;  Laterality: N/A;   SIGMOIDOSCOPY  02/12/1999   WISDOM TOOTH EXTRACTION  02/12/2000    Allergies  Allergen Reactions   Pollen Extract Other (See Comments)    Pet dander- Hives   Shellfish Allergy Hives    Current Outpatient Medications  Medication Sig Dispense Refill   cetirizine (ZYRTEC) 10 MG tablet Take 10 mg by mouth daily as needed for allergies.     ferrous sulfate 325 (65 FE) MG tablet Take 325 mg by mouth in the morning and at bedtime.     fluticasone (FLONASE) 50 MCG/ACT nasal spray Place 2 sprays into both nostrils daily. (Patient taking differently: Place 2 sprays into both nostrils daily as needed for allergies.) 48 g 3   lamoTRIgine (LAMICTAL) 100 MG tablet Take 1 tablet (100 mg total) by mouth at bedtime. 60 tablet 5   Multiple Vitamin (MULTIVITAMIN WITH MINERALS) TABS tablet Take 1 tablet by mouth in the morning. Women's One A Day     No current facility-administered medications for this visit.    Family  History Family History  Problem Relation Age of Onset   Hypertension Mother    GER disease Mother    Colon polyps Father    Gallbladder disease Father    Crohn's disease Father    Hypertension Father    Hypertension Brother    Congestive Heart Failure Maternal Grandmother    Diabetes Paternal Grandmother    Dementia Paternal Grandfather    Thyroid disease Brother    Breast cancer Maternal Aunt       Social History Social History   Tobacco Use   Smoking status: Never   Smokeless tobacco: Never  Vaping Use   Vaping Use: Never used  Substance Use Topics   Alcohol use: Yes    Comment: occassional   Drug use: No        Review of Systems  Constitutional:  Positive for diaphoresis.  HENT:  Positive for congestion.   Eyes: Negative.   Respiratory:  Positive for cough.   Cardiovascular: Negative.   Gastrointestinal:  Positive for blood in stool.  Genitourinary: Negative.   Skin: Negative.   Neurological:  Positive for headaches.  Psychiatric/Behavioral: Negative.       Physical Exam Blood pressure (!) 156/92, pulse 88, temperature 99.1 F (37.3 C), temperature source Oral, height '5\' 6"'$  (1.676 m), weight 165 lb 3.2 oz (74.9 kg), last menstrual period 06/13/2021,  SpO2 99 %. Last Weight  Most recent update: 06/28/2021  9:05 AM    Weight  74.9 kg (165 lb 3.2 oz)             CONSTITUTIONAL: Well developed, and nourished, appropriately responsive and aware without distress.   EYES: Sclera non-icteric.   EARS, NOSE, MOUTH AND THROAT: Mask worn.   The oropharynx is clear. Oral mucosa is pink and moist.   Hearing is intact to voice.  NECK: Trachea is midline, and there is no jugular venous distension.  LYMPH NODES:  Lymph nodes in the neck are not enlarged. RESPIRATORY:  Lungs are clear, and breath sounds are equal bilaterally. Normal respiratory effort without pathologic use of accessory muscles. CARDIOVASCULAR: Heart is regular in rate and rhythm. GI: The abdomen is   soft, nontender, and nondistended. There were no palpable masses.  GU: Chaperone: Freda Munro.   External hemorrhoidal tags with associated fronds c/w condyloma.  Deferred today, previous  DRE: with intra-luminal lesions c/w same.  MUSCULOSKELETAL:  Symmetrical muscle tone appreciated in all four extremities.    SKIN: Skin turgor is normal. No pathologic skin lesions appreciated.  NEUROLOGIC:  Motor and sensation appear grossly normal.  Cranial nerves are grossly without defect. PSYCH:  Alert and oriented to person, place and time. Affect is appropriate for situation.  Data Reviewed I have personally reviewed what is currently available of the patient's imaging, recent labs and medical records.   Labs:     Latest Ref Rng & Units 04/16/2021    8:22 AM 01/11/2021    8:12 AM 12/11/2020   11:11 AM  CBC  WBC 4.0 - 10.5 K/uL 3.0   4.3   5.0    Hemoglobin 12.0 - 15.0 g/dL 13.4   11.7   7.4    Hematocrit 36.0 - 46.0 % 40.8   36.6   26.1    Platelets 150 - 400 K/uL 245   275   437        Latest Ref Rng & Units 04/16/2021    8:22 AM 12/11/2020   11:11 AM 12/06/2019   11:34 AM  CMP  Glucose 70 - 99 mg/dL 100   90   84    BUN 6 - 20 mg/dL '11   10   12    '$ Creatinine 0.44 - 1.00 mg/dL 0.77   0.83   0.99    Sodium 135 - 145 mmol/L 135   137   138    Potassium 3.5 - 5.1 mmol/L 3.2   4.1   3.9    Chloride 98 - 111 mmol/L 103   102   102    CO2 22 - 32 mmol/L '26   21   22    '$ Calcium 8.9 - 10.3 mg/dL 8.5   8.8   9.1    Total Protein 6.0 - 8.5 g/dL  8.5   8.4    Total Bilirubin 0.0 - 1.2 mg/dL  0.7   0.7    Alkaline Phos 44 - 121 IU/L  72   68    AST 0 - 40 IU/L  21   11    ALT 0 - 32 IU/L  24   12        Imaging:  Within last 24 hrs: No results found.  Assessment    Anal condyloma. 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 Risks of the above procedure been discussed in detail which include but not limited to anesthesia, bleeding, recurrence, anal stenosis or dysfunction, infection.  I believe she understands these risks are not all inclusive, and had her questions adequately answered.  She desires to proceed without any guarantees ever expressed or implied. She knows that we will intend a Fleet enema to be utilized the evening prior and the morning before surgery.  Face-to-face time spent with the patient and accompanying care providers(if present) was 25 minutes, with more than 50% of the time spent counseling, educating, and coordinating care of the patient.    These notes generated with voice recognition software. I apologize for typographical errors.  Ronny Bacon M.D., FACS 06/28/2021, 9:31 AM

## 2021-06-28 NOTE — Patient Instructions (Signed)
Please call the office with any questions or concerns.

## 2021-06-28 NOTE — H&P (View-Only) (Signed)
Patient ID: Lisa Knox, female   DOB: April 04, 1980, 41 y.o.   MRN: [id number]  Chief Complaint:  Anal condyloma   History of Present Illness Lisa Knox is a 41 y.o. female with known anal condyloma for approximately 2 years.  Reports normal bowel activity, denies any particular issues.  Referred by her gynecologist.  She denies any pain, itching or tenderness.  She denies any changes in known warts, no remarkable bleeding, persistent lesions on BM and anal hygiene.   Past Medical History Past Medical History:  Diagnosis Date   Anal fissure 02/12/1999   Anemia    Condyloma    Depression    Headache    H/O   Hemorrhoids    Hypertension    never on medication; treated with weight loss and diet      Past Surgical History:  Procedure Laterality Date   BREAST BIOPSY Left    negative   COLPOSCOPY N/A 04/17/2021   Procedure: COLPOSCOPY WITH CERVICAL BIOPSY AND CURETTAGE;  Surgeon: Homero Fellers, MD;  Location: [facility] ORS;  Service: Gynecology;  Laterality: N/A;   SIGMOIDOSCOPY  02/12/1999   WISDOM TOOTH EXTRACTION  02/12/2000    Allergies  Allergen Reactions   Pollen Extract Other (See Comments)    Pet dander- Hives   Shellfish Allergy Hives    Current Outpatient Medications  Medication Sig Dispense Refill   cetirizine (ZYRTEC) 10 MG tablet Take 10 mg by mouth daily as needed for allergies.     ferrous sulfate 325 (65 FE) MG tablet Take 325 mg by mouth in the morning and at bedtime.     fluticasone (FLONASE) 50 MCG/ACT nasal spray Place 2 sprays into both nostrils daily. (Patient taking differently: Place 2 sprays into both nostrils daily as needed for allergies.) 48 g 3   lamoTRIgine (LAMICTAL) 100 MG tablet Take 1 tablet (100 mg total) by mouth at bedtime. 60 tablet 5   Multiple Vitamin (MULTIVITAMIN WITH MINERALS) TABS tablet Take 1 tablet by mouth in the morning. Women's One A Day     No current facility-administered medications for this visit.    Family  History Family History  Problem Relation Age of Onset   Hypertension Mother    GER disease Mother    Colon polyps Father    Gallbladder disease Father    Crohn's disease Father    Hypertension Father    Hypertension Brother    Congestive Heart Failure Maternal Grandmother    Diabetes Paternal Grandmother    Dementia Paternal Grandfather    Thyroid disease Brother    Breast cancer Maternal Aunt       Social History Social History   Tobacco Use   Smoking status: Never   Smokeless tobacco: Never  Vaping Use   Vaping Use: Never used  Substance Use Topics   Alcohol use: Yes    Comment: occassional   Drug use: No        Review of Systems  Constitutional:  Positive for diaphoresis.  HENT:  Positive for congestion.   Eyes: Negative.   Respiratory:  Positive for cough.   Cardiovascular: Negative.   Gastrointestinal:  Positive for blood in stool.  Genitourinary: Negative.   Skin: Negative.   Neurological:  Positive for headaches.  Psychiatric/Behavioral: Negative.       Physical Exam Blood pressure (!) 156/92, pulse 88, temperature 99.1 F (37.3 C), temperature source Oral, height '5\' 6"'$  (1.676 m), weight 165 lb 3.2 oz (74.9 kg), last menstrual period 06/13/2021,  SpO2 99 %. Last Weight  Most recent update: 06/28/2021  9:05 AM    Weight  74.9 kg (165 lb 3.2 oz)             CONSTITUTIONAL: Well developed, and nourished, appropriately responsive and aware without distress.   EYES: Sclera non-icteric.   EARS, NOSE, MOUTH AND THROAT: Mask worn.   The oropharynx is clear. Oral mucosa is pink and moist.   Hearing is intact to voice.  NECK: Trachea is midline, and there is no jugular venous distension.  LYMPH NODES:  Lymph nodes in the neck are not enlarged. RESPIRATORY:  Lungs are clear, and breath sounds are equal bilaterally. Normal respiratory effort without pathologic use of accessory muscles. CARDIOVASCULAR: Heart is regular in rate and rhythm. GI: The abdomen is   soft, nontender, and nondistended. There were no palpable masses.  GU: Chaperone: Freda Munro.   External hemorrhoidal tags with associated fronds c/w condyloma.  Deferred today, previous  DRE: with intra-luminal lesions c/w same.  MUSCULOSKELETAL:  Symmetrical muscle tone appreciated in all four extremities.    SKIN: Skin turgor is normal. No pathologic skin lesions appreciated.  NEUROLOGIC:  Motor and sensation appear grossly normal.  Cranial nerves are grossly without defect. PSYCH:  Alert and oriented to person, place and time. Affect is appropriate for situation.  Data Reviewed I have personally reviewed what is currently available of the patient's imaging, recent labs and medical records.   Labs:     Latest Ref Rng & Units 04/16/2021    8:22 AM 01/11/2021    8:12 AM 12/11/2020   11:11 AM  CBC  WBC 4.0 - 10.5 K/uL 3.0   4.3   5.0    Hemoglobin 12.0 - 15.0 g/dL 13.4   11.7   7.4    Hematocrit 36.0 - 46.0 % 40.8   36.6   26.1    Platelets 150 - 400 K/uL 245   275   437        Latest Ref Rng & Units 04/16/2021    8:22 AM 12/11/2020   11:11 AM 12/06/2019   11:34 AM  CMP  Glucose 70 - 99 mg/dL 100   90   84    BUN 6 - 20 mg/dL '11   10   12    '$ Creatinine 0.44 - 1.00 mg/dL 0.77   0.83   0.99    Sodium 135 - 145 mmol/L 135   137   138    Potassium 3.5 - 5.1 mmol/L 3.2   4.1   3.9    Chloride 98 - 111 mmol/L 103   102   102    CO2 22 - 32 mmol/L '26   21   22    '$ Calcium 8.9 - 10.3 mg/dL 8.5   8.8   9.1    Total Protein 6.0 - 8.5 g/dL  8.5   8.4    Total Bilirubin 0.0 - 1.2 mg/dL  0.7   0.7    Alkaline Phos 44 - 121 IU/L  72   68    AST 0 - 40 IU/L  21   11    ALT 0 - 32 IU/L  24   12        Imaging:  Within last 24 hrs: No results found.  Assessment    Anal condyloma. 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 Risks of the above procedure been discussed in detail which include but not limited to anesthesia, bleeding, recurrence, anal stenosis or dysfunction, infection.  I believe she understands these risks are not all inclusive, and had her questions adequately answered.  She desires to proceed without any guarantees ever expressed or implied. She knows that we will intend a Fleet enema to be utilized the evening prior and the morning before surgery.  Face-to-face time spent with the patient and accompanying care providers(if present) was 25 minutes, with more than 50% of the time spent counseling, educating, and coordinating care of the patient.    These notes generated with voice recognition software. I apologize for typographical errors.  Ronny Bacon M.D., FACS 06/28/2021, 9:31 AM

## 2021-07-05 MED ORDER — FLEET ENEMA 7-19 GM/118ML RE ENEM: 1.0000 | ENEMA | Freq: Once | RECTAL | Status: DC

## 2021-07-05 MED ORDER — CHLORHEXIDINE GLUCONATE CLOTH 2 % EX PADS: 6.0000 | MEDICATED_PAD | Freq: Once | CUTANEOUS | Status: DC

## 2021-07-05 MED ORDER — LACTATED RINGERS IV SOLN
INTRAVENOUS | Status: DC
Start: 2021-07-05 — End: 2021-07-06

## 2021-07-05 MED ORDER — FAMOTIDINE 20 MG PO TABS: 20.0000 mg | ORAL_TABLET | Freq: Once | ORAL | Status: AC

## 2021-07-05 MED ORDER — CHLORHEXIDINE GLUCONATE 0.12 % MT SOLN: 15.0000 mL | Freq: Once | OROMUCOSAL | Status: AC

## 2021-07-05 MED ORDER — ORAL CARE MOUTH RINSE: 15.0000 mL | Freq: Once | OROMUCOSAL | Status: AC

## 2021-07-06 ENCOUNTER — Ambulatory Visit: Payer: BC Managed Care – PPO | Admitting: Certified Registered Nurse Anesthetist

## 2021-07-06 ENCOUNTER — Other Ambulatory Visit: Payer: Self-pay

## 2021-07-06 ENCOUNTER — Encounter
Admission: RE | Disposition: A | Discharge status: Home / Self Care | Payer: Self-pay | Source: Home / Self Care | Attending: Surgery

## 2021-07-06 ENCOUNTER — Ambulatory Visit
Admission: RE | Admit: 2021-07-06 | Discharge: 2021-07-06 | Disposition: A | Discharge status: Home / Self Care | Payer: BC Managed Care – PPO | Attending: Surgery | Admitting: Surgery

## 2021-07-06 ENCOUNTER — Encounter: Payer: Self-pay | Admitting: Surgery

## 2021-07-06 DIAGNOSIS — K641 Second degree hemorrhoids: Secondary | ICD-10-CM | POA: Insufficient documentation

## 2021-07-06 DIAGNOSIS — F319 Bipolar disorder, unspecified: Secondary | ICD-10-CM | POA: Diagnosis not present

## 2021-07-06 DIAGNOSIS — I10 Essential (primary) hypertension: Secondary | ICD-10-CM | POA: Insufficient documentation

## 2021-07-06 DIAGNOSIS — A630 Anogenital (venereal) warts: Secondary | ICD-10-CM | POA: Diagnosis present

## 2021-07-06 DIAGNOSIS — Z01812 Encounter for preprocedural laboratory examination: Secondary | ICD-10-CM

## 2021-07-06 DIAGNOSIS — R519 Headache, unspecified: Secondary | ICD-10-CM | POA: Diagnosis not present

## 2021-07-06 DIAGNOSIS — K644 Residual hemorrhoidal skin tags: Secondary | ICD-10-CM | POA: Diagnosis not present

## 2021-07-06 HISTORY — PX: CONDYLOMA EXCISION/FULGURATION: SHX1389

## 2021-07-06 LAB — POCT PREGNANCY, URINE: Preg Test, Ur: NEGATIVE

## 2021-07-06 SURGERY — REMOVAL, CONDYLOMA
Anesthesia: General

## 2021-07-06 MED ORDER — BUPIVACAINE-EPINEPHRINE (PF) 0.25% -1:200000 IJ SOLN
INTRAMUSCULAR | Status: AC
  Filled 2021-07-06: qty 30

## 2021-07-06 MED ORDER — BUPIVACAINE LIPOSOME 1.3 % IJ SUSP
INTRAMUSCULAR | Status: DC | PRN
  Administered 2021-07-06: 20 mL

## 2021-07-06 MED ORDER — ACETAMINOPHEN 10 MG/ML IV SOLN
INTRAVENOUS | Status: AC
  Filled 2021-07-06: qty 100

## 2021-07-06 MED ORDER — LACTATED RINGERS IV SOLN: INTRAVENOUS | Status: DC | PRN

## 2021-07-06 MED ORDER — PHENYLEPHRINE 80 MCG/ML (10ML) SYRINGE FOR IV PUSH (FOR BLOOD PRESSURE SUPPORT)
PREFILLED_SYRINGE | INTRAVENOUS | Status: AC
  Filled 2021-07-06: qty 10

## 2021-07-06 MED ORDER — MIDAZOLAM HCL 2 MG/2ML IJ SOLN
INTRAMUSCULAR | Status: DC | PRN
  Administered 2021-07-06: 2 mg via INTRAVENOUS

## 2021-07-06 MED ORDER — PROPOFOL 10 MG/ML IV BOLUS
INTRAVENOUS | Status: AC
  Filled 2021-07-06: qty 40

## 2021-07-06 MED ORDER — ESMOLOL HCL 100 MG/10ML IV SOLN
INTRAVENOUS | Status: AC
  Filled 2021-07-06: qty 10

## 2021-07-06 MED ORDER — GLYCOPYRROLATE 0.2 MG/ML IJ SOLN
INTRAMUSCULAR | Status: DC | PRN
  Administered 2021-07-06: .2 mg via INTRAVENOUS

## 2021-07-06 MED ORDER — MIDAZOLAM HCL 2 MG/2ML IJ SOLN
INTRAMUSCULAR | Status: AC
  Filled 2021-07-06: qty 2

## 2021-07-06 MED ORDER — ROCURONIUM BROMIDE 100 MG/10ML IV SOLN
INTRAVENOUS | Status: DC | PRN
  Administered 2021-07-06: 50 mg via INTRAVENOUS

## 2021-07-06 MED ORDER — ROCURONIUM BROMIDE 10 MG/ML (PF) SYRINGE
PREFILLED_SYRINGE | INTRAVENOUS | Status: AC
  Filled 2021-07-06: qty 20

## 2021-07-06 MED ORDER — DEXAMETHASONE SODIUM PHOSPHATE 10 MG/ML IJ SOLN
INTRAMUSCULAR | Status: AC
  Filled 2021-07-06: qty 4

## 2021-07-06 MED ORDER — ACETAMINOPHEN 10 MG/ML IV SOLN
INTRAVENOUS | Status: DC | PRN
  Administered 2021-07-06: 1000 mg via INTRAVENOUS

## 2021-07-06 MED ORDER — GLYCOPYRROLATE 0.2 MG/ML IJ SOLN
INTRAMUSCULAR | Status: AC
  Filled 2021-07-06: qty 3

## 2021-07-06 MED ORDER — SUGAMMADEX SODIUM 200 MG/2ML IV SOLN
INTRAVENOUS | Status: DC | PRN
  Administered 2021-07-06: 20 mg via INTRAVENOUS
  Administered 2021-07-06: 30 mg via INTRAVENOUS
  Administered 2021-07-06: 50 mg via INTRAVENOUS

## 2021-07-06 MED ORDER — FAMOTIDINE 20 MG PO TABS
ORAL_TABLET | ORAL | Status: AC
  Administered 2021-07-06: 20 mg via ORAL
  Filled 2021-07-06: qty 1

## 2021-07-06 MED ORDER — ESMOLOL HCL 100 MG/10ML IV SOLN
INTRAVENOUS | Status: DC | PRN
  Administered 2021-07-06: 50 mg via INTRAVENOUS
  Administered 2021-07-06: 20 mg via INTRAVENOUS
  Administered 2021-07-06: 30 mg via INTRAVENOUS

## 2021-07-06 MED ORDER — GELATIN ABSORBABLE 100 CM EX MISC
CUTANEOUS | Status: DC | PRN
  Administered 2021-07-06: 1 via TOPICAL

## 2021-07-06 MED ORDER — DIBUCAINE (PERIANAL) 1 % EX OINT
TOPICAL_OINTMENT | CUTANEOUS | Status: AC
  Filled 2021-07-06: qty 28

## 2021-07-06 MED ORDER — BUPIVACAINE LIPOSOME 1.3 % IJ SUSP
INTRAMUSCULAR | Status: AC
  Filled 2021-07-06: qty 20

## 2021-07-06 MED ORDER — FENTANYL CITRATE (PF) 100 MCG/2ML IJ SOLN
INTRAMUSCULAR | Status: DC | PRN
  Administered 2021-07-06 (×2): 50 ug via INTRAVENOUS
  Administered 2021-07-06: 100 ug via INTRAVENOUS

## 2021-07-06 MED ORDER — PROMETHAZINE HCL 25 MG/ML IJ SOLN: 6.2500 mg | INTRAMUSCULAR | Status: DC | PRN

## 2021-07-06 MED ORDER — FENTANYL CITRATE (PF) 100 MCG/2ML IJ SOLN
INTRAMUSCULAR | Status: AC
  Filled 2021-07-06: qty 2

## 2021-07-06 MED ORDER — LIDOCAINE HCL (CARDIAC) PF 100 MG/5ML IV SOSY
PREFILLED_SYRINGE | INTRAVENOUS | Status: DC | PRN
  Administered 2021-07-06: 80 mg via INTRAVENOUS

## 2021-07-06 MED ORDER — EPHEDRINE 5 MG/ML INJ
INTRAVENOUS | Status: AC
  Filled 2021-07-06: qty 5

## 2021-07-06 MED ORDER — LIDOCAINE HCL (PF) 2 % IJ SOLN
INTRAMUSCULAR | Status: AC
  Filled 2021-07-06: qty 20

## 2021-07-06 MED ORDER — PROPOFOL 10 MG/ML IV BOLUS
INTRAVENOUS | Status: DC | PRN
  Administered 2021-07-06: 160 mg via INTRAVENOUS
  Administered 2021-07-06: 40 mg via INTRAVENOUS
  Administered 2021-07-06: 100 mg via INTRAVENOUS

## 2021-07-06 MED ORDER — IBUPROFEN 800 MG PO TABS: 800.0000 mg | ORAL_TABLET | Freq: Three times a day (TID) | ORAL | 0 refills | Status: AC | PRN

## 2021-07-06 MED ORDER — 0.9 % SODIUM CHLORIDE (POUR BTL) OPTIME
TOPICAL | Status: DC | PRN
  Administered 2021-07-06: 500 mL

## 2021-07-06 MED ORDER — GELATIN ABSORBABLE 100 CM EX MISC
CUTANEOUS | Status: AC
  Filled 2021-07-06: qty 1

## 2021-07-06 MED ORDER — DEXMEDETOMIDINE (PRECEDEX) IN NS 20 MCG/5ML (4 MCG/ML) IV SYRINGE
PREFILLED_SYRINGE | INTRAVENOUS | Status: DC | PRN
  Administered 2021-07-06 (×3): 12 ug via INTRAVENOUS

## 2021-07-06 MED ORDER — LABETALOL HCL 5 MG/ML IV SOLN
INTRAVENOUS | Status: DC | PRN
  Administered 2021-07-06: 5 mg via INTRAVENOUS

## 2021-07-06 MED ORDER — BUPIVACAINE-EPINEPHRINE 0.25% -1:200000 IJ SOLN
INTRAMUSCULAR | Status: DC | PRN
  Administered 2021-07-06: 10 mL

## 2021-07-06 MED ORDER — SUCCINYLCHOLINE CHLORIDE 200 MG/10ML IV SOSY
PREFILLED_SYRINGE | INTRAVENOUS | Status: AC
  Filled 2021-07-06: qty 10

## 2021-07-06 MED ORDER — FENTANYL CITRATE (PF) 100 MCG/2ML IJ SOLN: 25.0000 ug | INTRAMUSCULAR | Status: DC | PRN

## 2021-07-06 MED ORDER — ONDANSETRON HCL 4 MG/2ML IJ SOLN
INTRAMUSCULAR | Status: AC
  Filled 2021-07-06: qty 10

## 2021-07-06 MED ORDER — DEXAMETHASONE SODIUM PHOSPHATE 10 MG/ML IJ SOLN
INTRAMUSCULAR | Status: DC | PRN
  Administered 2021-07-06: 10 mg via INTRAVENOUS

## 2021-07-06 MED ORDER — CHLORHEXIDINE GLUCONATE 0.12 % MT SOLN
OROMUCOSAL | Status: AC
  Administered 2021-07-06: 15 mL via OROMUCOSAL
  Filled 2021-07-06: qty 15

## 2021-07-06 MED ORDER — ONDANSETRON HCL 4 MG/2ML IJ SOLN
INTRAMUSCULAR | Status: DC | PRN
  Administered 2021-07-06: 4 mg via INTRAVENOUS

## 2021-07-06 MED ORDER — DIBUCAINE 1 % EX OINT
TOPICAL_OINTMENT | CUTANEOUS | Status: DC | PRN
  Administered 2021-07-06: 1 via TOPICAL

## 2021-07-06 MED ORDER — HYDROCODONE-ACETAMINOPHEN 5-325 MG PO TABS: 1.0000 | ORAL_TABLET | Freq: Four times a day (QID) | ORAL | 0 refills | Status: DC | PRN

## 2021-07-06 SURGICAL SUPPLY — 33 items
ADAPTER SMOKE EVAC 7/8 TO 3/8 (ADAPTER) ×2 IMPLANT
BLADE SURG 15 STRL LF DISP TIS (BLADE) ×1 IMPLANT
BLADE SURG 15 STRL SS (BLADE) ×2
DRAPE LAPAROTOMY 100X77 ABD (DRAPES) ×2 IMPLANT
DRAPE LEGGINS SURG 28X43 STRL (DRAPES) ×2 IMPLANT
DRAPE UNDER BUTTOCK W/FLU (DRAPES) ×2 IMPLANT
DRSG GAUZE FLUFF 36X18 (GAUZE/BANDAGES/DRESSINGS) ×2 IMPLANT
ELECT CAUTERY BLADE TIP 2.5 (TIP) ×2
ELECT REM PT RETURN 9FT ADLT (ELECTROSURGICAL) ×2
ELECTRODE CAUTERY BLDE TIP 2.5 (TIP) ×1 IMPLANT
ELECTRODE REM PT RTRN 9FT ADLT (ELECTROSURGICAL) ×1 IMPLANT
GAUZE 4X4 16PLY ~~LOC~~+RFID DBL (SPONGE) ×2 IMPLANT
GLOVE ORTHO TXT STRL SZ7.5 (GLOVE) ×2 IMPLANT
GOWN STRL REUS W/ TWL LRG LVL3 (GOWN DISPOSABLE) ×1 IMPLANT
GOWN STRL REUS W/ TWL XL LVL3 (GOWN DISPOSABLE) ×1 IMPLANT
GOWN STRL REUS W/TWL LRG LVL3 (GOWN DISPOSABLE) ×2
GOWN STRL REUS W/TWL XL LVL3 (GOWN DISPOSABLE) ×2
KIT TURNOVER KIT A (KITS) ×2 IMPLANT
MANIFOLD NEPTUNE II (INSTRUMENTS) ×2 IMPLANT
NDL SAFETY ECLIPSE 18X1.5 (NEEDLE) ×1 IMPLANT
NEEDLE HYPO 18GX1.5 SHARP (NEEDLE) ×2
NEEDLE HYPO 22GX1.5 SAFETY (NEEDLE) ×2 IMPLANT
NS IRRIG 500ML POUR BTL (IV SOLUTION) ×2 IMPLANT
PACK BASIN MINOR ARMC (MISCELLANEOUS) ×2 IMPLANT
PANTS MESH DISP 2XL (UNDERPADS AND DIAPERS) ×1 IMPLANT
PANTS MESH DISPOSABLE 2XL (UNDERPADS AND DIAPERS) ×1
SHEARS HARMONIC 9CM CVD (BLADE) ×1 IMPLANT
SOL PREP PVP 2OZ (MISCELLANEOUS) ×2
SOLUTION PREP PVP 2OZ (MISCELLANEOUS) ×1 IMPLANT
SURGILUBE 2OZ TUBE FLIPTOP (MISCELLANEOUS) ×2 IMPLANT
SYR 10ML LL (SYRINGE) ×2 IMPLANT
TUBING SMOKE EVAC 6FT (TUBING) ×2 IMPLANT
WATER STERILE IRR 500ML POUR (IV SOLUTION) ×2 IMPLANT

## 2021-07-06 NOTE — Anesthesia Postprocedure Evaluation (Signed)
Anesthesia Post Note  Patient: Lisa Knox  Procedure(s) Performed: CONDYLOMA REMOVAL, excision of anal condyloma, possible hemorrhoidectomy  Patient location during evaluation: PACU Anesthesia Type: General Level of consciousness: awake and alert Pain management: pain level controlled Vital Signs Assessment: post-procedure vital signs reviewed and stable Respiratory status: spontaneous breathing, nonlabored ventilation, respiratory function stable and patient connected to nasal cannula oxygen Cardiovascular status: blood pressure returned to baseline and stable Postop Assessment: no apparent nausea or vomiting Anesthetic complications: no   No notable events documented.   Last Vitals:  Vitals:   07/06/21 1000 07/06/21 1009  BP: (!) 158/102 (!) 140/98  Pulse: 82 80  Resp: (!) 21 16  Temp:  36.7 C  SpO2: 94% 95%    Last Pain:  Vitals:   07/06/21 1009  TempSrc: Temporal  PainSc: 0-No pain                 Martha Clan

## 2021-07-06 NOTE — Anesthesia Procedure Notes (Signed)
Procedure Name: Intubation Date/Time: 07/06/2021 7:52 AM Performed by: Gayland Curry, CRNA Pre-anesthesia Checklist: Patient identified, Emergency Drugs available, Suction available and Patient being monitored Patient Re-evaluated:Patient Re-evaluated prior to induction Oxygen Delivery Method: Circle system utilized Preoxygenation: Pre-oxygenation with 100% oxygen Induction Type: Combination inhalational/ intravenous induction Laryngoscope Size: Mac and 3 Grade View: Grade I Tube type: Oral Tube size: 7.0 mm Number of attempts: 1 Placement Confirmation: ETT inserted through vocal cords under direct vision, positive ETCO2 and breath sounds checked- equal and bilateral Secured at: 22 cm Tube secured with: Tape Dental Injury: Teeth and Oropharynx as per pre-operative assessment

## 2021-07-06 NOTE — Interval H&P Note (Signed)
History and Physical Interval Note:  07/06/2021 7:17 AM  Lisa Knox  has presented today for surgery, with the diagnosis of anal condylomata.  The various methods of treatment have been discussed with the patient and family. After consideration of risks, benefits and other options for treatment, the patient has consented to  Procedure(s): CONDYLOMA REMOVAL, excision of anal condyloma, possible hemorrhoidectomy (N/A) as a surgical intervention.  The patient's history has been reviewed, patient examined, no change in status, stable for surgery.  I have reviewed the patient's chart and labs.  Questions were answered to the patient's satisfaction.     Ronny Bacon

## 2021-07-06 NOTE — Discharge Instructions (Signed)
AMBULATORY SURGERY  ?DISCHARGE INSTRUCTIONS ? ? ?The drugs that you were given will stay in your system until tomorrow so for the next 24 hours you should not: ? ?Drive an automobile ?Make any legal decisions ?Drink any alcoholic beverage ? ? ?You may resume regular meals tomorrow.  Today it is better to start with liquids and gradually work up to solid foods. ? ?You may eat anything you prefer, but it is better to start with liquids, then soup and crackers, and gradually work up to solid foods. ? ? ?Please notify your doctor immediately if you have any unusual bleeding, trouble breathing, redness and pain at the surgery site, drainage, fever, or pain not relieved by medication. ? ? ? ?Additional Instructions: ? ? ? ?Please contact your physician with any problems or Same Day Surgery at [phone], Monday through Friday 6 am to 4 pm, or [facility] [place] Main number at [phone].  ?

## 2021-07-06 NOTE — Transfer of Care (Signed)
Immediate Anesthesia Transfer of Care Note  Patient: Lisa Knox  Procedure(s) Performed: CONDYLOMA REMOVAL, excision of anal condyloma, possible hemorrhoidectomy  Patient Location: PACU  Anesthesia Type:General  Level of Consciousness: drowsy  Airway & Oxygen Therapy: Patient Spontanous Breathing and Patient connected to nasal cannula oxygen  Post-op Assessment: Report given to RN  Post vital signs: Reviewed and stable  Last Vitals:  Vitals Value Taken Time  BP 153/99 07/06/21 0915  Temp    Pulse 79 07/06/21 0917  Resp 15 07/06/21 0917  SpO2 97 % 07/06/21 0917  Vitals shown include unvalidated device data.  Last Pain:  Vitals:   07/06/21 0635  TempSrc: Oral  PainSc: 0-No pain         Complications: No notable events documented.

## 2021-07-06 NOTE — Anesthesia Procedure Notes (Signed)
Procedure Name: LMA Insertion Date/Time: 07/06/2021 7:40 AM Performed by: Gayland Curry, CRNA Pre-anesthesia Checklist: Patient identified, Emergency Drugs available, Suction available and Patient being monitored Patient Re-evaluated:Patient Re-evaluated prior to induction Oxygen Delivery Method: Circle system utilized Preoxygenation: Pre-oxygenation with 100% oxygen Induction Type: IV induction Ventilation: Mask ventilation without difficulty LMA: LMA inserted LMA Size: 4.0 Tube type: Oral Number of attempts: 1 Placement Confirmation: ETT inserted through vocal cords under direct vision, positive ETCO2 and breath sounds checked- equal and bilateral Tube secured with: Tape Dental Injury: Teeth and Oropharynx as per pre-operative assessment

## 2021-07-06 NOTE — Anesthesia Preprocedure Evaluation (Signed)
Anesthesia Evaluation  Patient identified by MRN, date of birth, ID band Patient awake    Reviewed: Allergy & Precautions, NPO status , Patient's Chart, lab work & pertinent test results  History of Anesthesia Complications Negative for: history of anesthetic complications  Airway Mallampati: II  TM Distance: >3 FB Neck ROM: full    Dental  (+) Chipped   Pulmonary neg pulmonary ROS, neg shortness of breath, neg recent URI,    Pulmonary exam normal        Cardiovascular Exercise Tolerance: Good hypertension, (-) angina(-) Past MI and (-) Cardiac Stents Normal cardiovascular exam(-) dysrhythmias (-) Valvular Problems/Murmurs     Neuro/Psych  Headaches, neg Seizures PSYCHIATRIC DISORDERS Depression Bipolar Disorder    GI/Hepatic negative GI ROS, Neg liver ROS, neg GERD  ,  Endo/Other  negative endocrine ROS  Renal/GU      Musculoskeletal   Abdominal   Peds  Hematology negative hematology ROS (+)   Anesthesia Other Findings Past Medical History: 02/12/1999: Anal fissure No date: Anemia No date: Depression No date: Headache     Comment:  H/O No date: Hypertension  Past Surgical History: No date: BREAST SURGERY     Comment:  biopsy 02/12/1999: SIGMOIDOSCOPY 02/12/2000: WISDOM TOOTH EXTRACTION  BMI    Body Mass Index: 25.02 kg/m      Reproductive/Obstetrics negative OB ROS                             Anesthesia Physical  Anesthesia Plan  ASA: 2  Anesthesia Plan: General   Post-op Pain Management:    Induction: Intravenous  PONV Risk Score and Plan: Dexamethasone, Ondansetron, Midazolam and Treatment may vary due to age or medical condition  Airway Management Planned: LMA and Oral ETT  Additional Equipment:   Intra-op Plan:   Post-operative Plan: Extubation in OR  Informed Consent: I have reviewed the patients History and Physical, chart, labs and discussed the  procedure including the risks, benefits and alternatives for the proposed anesthesia with the patient or authorized representative who has indicated his/her understanding and acceptance.     Dental Advisory Given  Plan Discussed with: Anesthesiologist, CRNA and Surgeon  Anesthesia Plan Comments: (Patient consented for risks of anesthesia including but not limited to:  - adverse reactions to medications - damage to eyes, teeth, lips or other oral mucosa - nerve damage due to positioning  - sore throat or hoarseness - Damage to heart, brain, nerves, lungs, other parts of body or loss of life  Patient voiced understanding.)        Anesthesia Quick Evaluation

## 2021-07-06 NOTE — Op Note (Signed)
SURGICAL OPERATIVE REPORT   DATE OF PROCEDURE: 07/06/2021  ATTENDING Surgeon(s): Ronny Bacon, MD  ANESTHESIA: GETA  PRE-OPERATIVE DIAGNOSIS: Anal condyloma.  Symptomatic (painful)/Bleeding 2nd  degree internal and external hemorrhoids with symptoms not well-controlled despite medical management  POST-OPERATIVE DIAGNOSIS: Same  PROCEDURE(S):  1.) Anoscopy 2.) Hemorrhectomy, 3 columns, and excision of condylomatous skin tag(s)   INTRAOPERATIVE FINDINGS: 2nd degree internal hemorrhoids and external hemorrhoids with condylomatous skin tags  ESTIMATED BLOOD LOSS: 15 mL  URINE OUTPUT: No foley   SPECIMENS: Hemorrhoids condyloma and skin tag(s)   COMPLICATIONS: None apparent   CONDITION AT END OF PROCEDURE: Hemodynamically stable and extubated   DISPOSITION OF PATIENT: PACU  INDICATIONS FOR PROCEDURE:  Patient is a 41 y.o. female who presented with symptomatic (painful)/bleeding 2nd degree internal and external hemorrhoids not well-controlled with medical management.  She also has scattered external and intra-anal canal condyloma.  All risks, benefits, and alternatives to above procedure were discussed with the patient, all of patient's questions were answered to her expressed satisfaction, and informed consent was obtained and documented.   DETAILS OF PROCEDURE: Patient was brought to the operative suite and appropriately identified. General  anesthesia was administered along with appropriate pre-operative antibiotics, and endotracheal intubation was performed by anesthetist.  Repositioned to prone/jackknife and buttocks taped for exposure, operative site was prepped and draped in the usual sterile fashion, and following a brief time out, rigid anoscopy was performed with findings of no distal rectal masses, normal mucosa,  prominent areas involving anterior and posterior right and posterior left quadrants. Local anesthetic quarter percent Marcaine with epinephrine was injected, and   three hemorrhoidal pedicles were identified. An Alice clamp was placed near the base of each pedicle near the dentate line and retracted externally to exteriorize the hemorrhoidal pedicle. Retracted hemorrhoid was carefully dissected from underlying/adherent tissues, and  Harmonic focus was advanced across the base of the hemorrhoidal pedicle, and the above was repeated for all 3 internal/external hemorrhoids. The underlying internal sphincteric muscle fibers were preserved.   Additional cherry picking excision using the Harmonic and surgical cautery to excise/destroy lesions c/w and suspicious for condyloma.  Hemostasis was achieved/confirmed, a hemostatic locking 3-0 chromic suture is applied in running manner from proximally to distally, leaving the distal aspect open, and a Surgifoam is saturated with Nupercainal ointment was inserted into the anal canal for additional hemostasis and pain control. Fluffs, ABD, and mesh briefs were applied.  Patient was then safely able to be extubated, awakened, and transferred to PACU for post-operative monitoring and care.  Ronny Bacon, M.D., [facility]  07/06/2021 ; 9:38 AM

## 2021-07-11 LAB — SURGICAL PATHOLOGY

## 2021-07-19 ENCOUNTER — Ambulatory Visit (INDEPENDENT_AMBULATORY_CARE_PROVIDER_SITE_OTHER): Payer: BC Managed Care – PPO | Admitting: Surgery

## 2021-07-19 ENCOUNTER — Encounter: Payer: Self-pay | Admitting: Surgery

## 2021-07-19 VITALS — BP 156/99 | HR 92 | Temp 98.5°F | Wt 162.2 lb

## 2021-07-19 DIAGNOSIS — K641 Second degree hemorrhoids: Secondary | ICD-10-CM

## 2021-07-19 DIAGNOSIS — A630 Anogenital (venereal) warts: Secondary | ICD-10-CM

## 2021-07-19 NOTE — Patient Instructions (Signed)
If you have any concerns or questions, please feel free to call our office. See follow up appointment below.   Surgical Procedures for Hemorrhoids Surgical procedures can be used to treat hemorrhoids. Hemorrhoids are swollen veins that are inside the rectum (internal hemorrhoids) or around the anus (external hemorrhoids). They are caused by increased pressure in the anal area. This pressure may result from straining to have a bowel movement (constipation), diarrhea, pregnancy, obesity, or sitting for long periods of time. Hemorrhoids can cause symptoms such as pain and bleeding. Surgery may be needed if diet changes, lifestyle changes, and other treatments do not help your symptoms. Common surgical methods that may be used include: Closed hemorrhoidectomy. The hemorrhoids are surgically removed, and the incisions are closed with stitches (sutures). Open hemorrhoidectomy. The hemorrhoids are surgically removed, but the incisions are allowed to heal without sutures. Stapled hemorrhoidectomy. The hemorrhoids are partially removed, and the incisions are closed with staples. Tell a health care provider about: Any allergies you have. All medicines you are taking, including vitamins, herbs, eye drops, creams, and over-the-counter medicines. Any problems you or family members have had with anesthetic medicines. Any blood disorders you have. Any surgeries you have had. Any medical conditions you have. Whether you are pregnant or may be pregnant. What are the risks? Generally, this is a safe procedure. However, problems may occur, including: Infection. Bleeding. Allergic reactions to medicines. Damage to other structures or organs. Pain. Constipation. Difficulty passing urine. Narrowing of the anal canal (stenosis). Difficulty controlling bowel movements (incontinence). Recurring hemorrhoids. A new passage (fistula) that forms between the anus or rectum and another area. What happens before the  procedure? Medicines Ask your health care provider about: Changing or stopping your regular medicines. This is especially important if you are taking diabetes medicines or blood thinners. Taking medicines such as aspirin and ibuprofen. These medicines can thin your blood. Do not take these medicines unless your health care provider tells you to take them. Taking over-the-counter medicines, vitamins, herbs, and supplements. Staying hydrated Follow instructions from your health care provider about hydration, which may include: Up to 2 hours before the procedure - you may continue to drink clear liquids, such as water, clear fruit juice, black coffee, and plain tea.  Eating and drinking Follow instructions from your health care provider about eating and drinking, which may include: 8 hours before the procedure - stop eating heavy meals or foods, such as meat, fried foods, or fatty foods. 6 hours before the procedure - stop eating light meals or foods, such as toast or cereal. 6 hours before the procedure - stop drinking milk or drinks that contain milk. 2 hours before the procedure - stop drinking clear liquids. General instructions You may need to have a procedure to examine the inside of your colon with a scope (colonoscopy). Your health care provider may do this to make sure that there are no other causes for your bleeding or pain. You may be instructed to take a laxative and an enema to clean out your colon before surgery (bowel prep). Carefully follow instructions from your health care provider about bowel prep. Plan to have someone take you home from the hospital or clinic. Plan to have a responsible adult care for you for at least 24 hours after you leave the hospital or clinic. This is important. Ask your health care provider: How your surgery site will be marked. What steps will be taken to help prevent infection. These may include: Washing skin with a  germ-killing soap. Taking  antibiotic medicine. What happens during the procedure? An IV will be inserted into one of your veins. You will be given one or more of the following: A medicine to help you relax (sedative). A medicine to numb the area (local anesthetic). A medicine to make you fall asleep (general anesthetic). A medicine that is injected into an area of your body to numb everything below the injection site (regional anesthetic). A lubricating jelly may be placed into your rectum. Your surgeon will insert a short scope (anoscope) into your rectum to examine the hemorrhoids. One of the following surgical methods will be used to remove the hemorrhoids: Closed hemorrhoidectomy. Your surgeon will use surgical instruments to open the tissue around the hemorrhoids. The veins that supply the hemorrhoids will be tied off with a suture. The hemorrhoids will be removed. The tissue that surrounds the hemorrhoids will be closed with sutures that your body can absorb (absorbable sutures). Open hemorrhoidectomy. The hemorrhoids will be removed with surgical instruments. The incisions will be left open to heal without sutures. Stapled hemorrhoidectomy. Your surgeon will use a circular stapling device to partially remove the hemorrhoids. The device will be inserted into your anus. It will remove a circular ring of tissue that includes hemorrhoid tissue and some tissue above the hemorrhoids. The staples in the device will close the edges of the tissue. This will cut off the blood supply to any remaining hemorrhoids and pull the tissue back into place. Each of these procedures may vary among health care providers and hospitals. What happens after the procedure? Your blood pressure, heart rate, breathing rate, and blood oxygen level may be monitored until you leave the hospital or clinic. You will be given pain medicine as needed. Do not drive for 24 hours if you were given a sedative during your  procedure. Summary Surgery may be needed for hemorrhoids if diet changes, lifestyle changes, and other treatments do not help your symptoms. There are three common methods of surgery that are used to treat hemorrhoids. Follow instructions from your health care provider about taking medicines and about eating and drinking before the procedure. You may be instructed to take a laxative and an enema to clean out your colon before surgery (bowel prep). This information is not intended to replace advice given to you by your health care provider. Make sure you discuss any questions you have with your health care provider. Document Revised: 08/09/2020 Document Reviewed: 08/09/2020 Elsevier Patient Education  [employer].

## 2021-07-19 NOTE — Progress Notes (Signed)
[facility] POST-OP OFFICE VISIT  07/19/2021  HPI: Lisa Knox is a 41 y.o. female 13 days s/p hemorrhoidectomy with excision of anal condyloma/destruction of anal condyloma.  Remarkably she denies having any pain.  She denies nausea, vomiting, fevers or chills.  She reports no issues with bowel movements with minimal bleeding.  She is cautious and not wiping very aggressively.  Vital signs: BP (!) 156/99   Pulse 92   Temp 98.5 F (36.9 C) (Oral)   Wt 162 lb 3.2 oz (73.6 kg)   LMP 06/13/2021 (Exact Date)   SpO2 98%   BMI 26.18 kg/m    Physical Exam: Constitutional: She appears well  Skin: Levada Dy present as chaperone.  The perianal skin shows some progress in healing from the external excisions of the anal warts.  There is stool soilage present, however there is no evidence of active inflammation, or hemorrhoidal bleeding or evidence of fissure.  No internal exam completed.  Assessment/Plan: This is a 41 y.o. female 13 days s/p excision of anal warts and hemorrhoids.  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  She will need some form of surveillance for these at risk lesions.   Ronny Bacon M.D., FACS 07/19/2021, 10:49 AM

## 2021-08-21 ENCOUNTER — Encounter: Payer: Self-pay | Admitting: Surgery

## 2021-08-21 ENCOUNTER — Ambulatory Visit (INDEPENDENT_AMBULATORY_CARE_PROVIDER_SITE_OTHER): Payer: BC Managed Care – PPO | Admitting: Surgery

## 2021-08-21 VITALS — BP 155/96 | HR 108 | Temp 99.0°F | Wt 165.8 lb

## 2021-08-21 DIAGNOSIS — Z09 Encounter for follow-up examination after completed treatment for conditions other than malignant neoplasm: Secondary | ICD-10-CM

## 2021-08-21 DIAGNOSIS — A630 Anogenital (venereal) warts: Secondary | ICD-10-CM

## 2021-08-21 DIAGNOSIS — K641 Second degree hemorrhoids: Secondary | ICD-10-CM

## 2021-08-21 NOTE — Patient Instructions (Signed)
If you have any concerns or questions, please feel free to call our office. Follow up as needed.   Hemorrhoids Hemorrhoids are swollen veins that may develop: In the butt (rectum). These are called internal hemorrhoids. Around the opening of the butt (anus). These are called external hemorrhoids. Hemorrhoids can cause pain, itching, or bleeding. Most of the time, they do not cause serious problems. They usually get better with diet changes, lifestyle changes, and other home treatments. What are the causes? This condition may be caused by: Having trouble pooping (constipation). Pushing hard (straining) to poop. Watery poop (diarrhea). Pregnancy. Being very overweight (obese). Sitting for long periods of time. Heavy lifting or other activity that causes you to strain. Anal sex. Riding a bike for a long period of time. What are the signs or symptoms? Symptoms of this condition include: Pain. Itching or soreness in the butt. Bleeding from the butt. Leaking poop. Swelling in the area. One or more lumps around the opening of your butt. How is this diagnosed? A doctor can often diagnose this condition by looking at the affected area. The doctor may also: Do an exam that involves feeling the area with a gloved hand (digital rectal exam). Examine the area inside your butt using a small tube (anoscope). Order blood tests. This may be done if you have lost a lot of blood. Have you get a test that involves looking inside the colon using a flexible tube with a camera on the end (sigmoidoscopy or colonoscopy). How is this treated? This condition can usually be treated at home. Your doctor may tell you to change what you eat, make lifestyle changes, or try home treatments. If these do not help, procedures can be done to remove the hemorrhoids or make them smaller. These may involve: Placing rubber bands at the base of the hemorrhoids to cut off their blood supply. Injecting medicine into the  hemorrhoids to shrink them. Shining a type of light energy onto the hemorrhoids to cause them to fall off. Doing surgery to remove the hemorrhoids or cut off their blood supply. Follow these instructions at home: Eating and drinking  Eat foods that have a lot of fiber in them. These include whole grains, beans, nuts, fruits, and vegetables. Ask your doctor about taking products that have added fiber (fibersupplements). Reduce the amount of fat in your diet. You can do this by: Eating low-fat dairy products. Eating less red meat. Avoiding processed foods. Drink enough fluid to keep your pee (urine) pale yellow. Managing pain and swelling  Take a warm-water bath (sitz bath) for 20 minutes to ease pain. Do this 3-4 times a day. You may do this in a bathtub or using a portable sitz bath that fits over the toilet. If told, put ice on the painful area. It may be helpful to use ice between your warm baths. Put ice in a plastic bag. Place a towel between your skin and the bag. Leave the ice on for 20 minutes, 2-3 times a day. General instructions Take over-the-counter and prescription medicines only as told by your doctor. Medicated creams and medicines may be used as told. Exercise often. Ask your doctor how much and what kind of exercise is best for you. Go to the bathroom when you have the urge to poop. Do not wait. Avoid pushing too hard when you poop. Keep your butt dry and clean. Use wet toilet paper or moist towelettes after pooping. Do not sit on the toilet for a long time.  Keep all follow-up visits as told by your doctor. This is important. Contact a doctor if you: Have pain and swelling that do not get better with treatment or medicine. Have trouble pooping. Cannot poop. Have pain or swelling outside the area of the hemorrhoids. Get help right away if you have: Bleeding that will not stop. Summary Hemorrhoids are swollen veins in the butt or around the opening of the  butt. They can cause pain, itching, or bleeding. Eat foods that have a lot of fiber in them. These include whole grains, beans, nuts, fruits, and vegetables. Take a warm-water bath (sitz bath) for 20 minutes to ease pain. Do this 3-4 times a day. This information is not intended to replace advice given to you by your health care provider. Make sure you discuss any questions you have with your health care provider. Document Revised: 08/09/2020 Document Reviewed: 08/09/2020 Elsevier Patient Education  [place].

## 2021-08-21 NOTE — Progress Notes (Signed)
[facility] POST-OP OFFICE VISIT  08/21/2021  HPI: Lisa Knox is a 41 y.o. female 16 days s/p hemorrhoidectomy and excision of anal condyloma.  She reports she is doing great her bowel movements are regular, she denies any nausea vomiting fever chills.  Reports she is having no issues.  She denies seeing any bleeding.  Vital signs: BP (!) 155/96   Pulse (!) 108   Temp 99 F (37.2 C) (Oral)   Wt 165 lb 12.8 oz (75.2 kg)   SpO2 99%   BMI 26.76 kg/m    Physical Exam: Constitutional: She looks well. Skin: Perianal skin appears to be healing nicely.  There is still 1 slight raised edge of scar but otherwise well covered with epidermis.  No evidence of persisting or recurrent condylomata at this time.  Assessment/Plan: This is a 41 y.o. female 42 days s/p excision of condyloma with hemorrhoidectomy.  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  I will be glad to see her again as needed to continue surveillance.   Ronny Bacon M.D., FACS 08/21/2021, 10:06 AM

## 2021-12-12 NOTE — Progress Notes (Signed)
I,Lisa Knox,acting as a Education administrator for Lisa Paganini, MD.,have documented all relevant documentation on the behalf of Lisa Paganini, MD,as directed by  Lisa Paganini, MD while in the presence of Lisa Paganini, MD.    Complete physical exam   Patient: Lisa Knox   DOB: Jan 03, 1981   41 y.o. Female  MRN: [id number] Visit Date: 12/14/2021  Today's healthcare provider: [name], MD   Chief Complaint  Patient presents with   Annual Exam   Subjective    Lisa Knox is a 41 y.o. female who presents today for a complete physical exam.  She reports consuming a general diet. The patient does not participate in regular exercise at present. She generally feels well. She reports sleeping well. She does not have additional problems to discuss today.  HPI    Past Medical History:  Diagnosis Date   Anal fissure 02/12/1999   Anemia    Condyloma    Depression    Headache    H/O   Hemorrhoids    Hypertension    never on medication; treated with weight loss and diet   Past Surgical History:  Procedure Laterality Date   BREAST BIOPSY Left    negative   COLPOSCOPY N/A 04/17/2021   Procedure: COLPOSCOPY WITH CERVICAL BIOPSY AND CURETTAGE;  Surgeon: Lisa Fellers, MD;  Location: [facility] ORS;  Service: Gynecology;  Laterality: N/A;   CONDYLOMA EXCISION/FULGURATION N/A 07/06/2021   Procedure: CONDYLOMA REMOVAL, excision of anal condyloma, possible hemorrhoidectomy;  Surgeon: Lisa Bacon, MD;  Location: [facility] ORS;  Service: General;  Laterality: N/A;   SIGMOIDOSCOPY  02/12/1999   WISDOM TOOTH EXTRACTION  02/12/2000   Social History   Socioeconomic History   Marital status: Single    Spouse name: Not on file   Number of children: 0   Years of education: 18   Highest education level: Master's degree (e.g., MA, MS, MEng, MEd, MSW, MBA)  Occupational History    Employer: [employer]  Tobacco Use   Smoking status: Never   Smokeless  tobacco: Never  Vaping Use   Vaping Use: Never used  Substance and Sexual Activity   Alcohol use: Yes    Comment: occassional   Drug use: No   Sexual activity: Yes    Birth control/protection: None  Other Topics Concern   Not on file  Social History Narrative   Lives with brother   Social Determinants of Health   Financial Resource Strain: Low Risk  (01/13/2017)   Overall Financial Resource Strain (CARDIA)    Difficulty of Paying Living Expenses: Not hard at all  Food Insecurity: No Food Insecurity (01/13/2017)   Hunger Vital Sign    Worried About Running Out of Food in the Last Year: Never true    Ran Out of Food in the Last Year: Never true  Transportation Needs: No Transportation Needs (01/13/2017)   PRAPARE - Hydrologist (Medical): No    Lack of Transportation (Non-Medical): No  Physical Activity: Inactive (01/13/2017)   Exercise Vital Sign    Days of Exercise per Week: 0 days    Minutes of Exercise per Session: 0 min  Stress: Not on file  Social Connections: Not on file  Intimate Partner Violence: Not on file   Family Status  Relation Name Status   Mother  Alive   Father  Alive   Brother 1 Alive   MGM  Deceased at age 14   MGF  Deceased  PGM  Deceased at age 17   PGF  Deceased at age 79   Brother 46 Alive   Mat 19  Alive   Family History  Problem Relation Age of Onset   Hypertension Mother    GER disease Mother    Colon polyps Father    Gallbladder disease Father    Crohn's disease Father    Hypertension Father    Hypertension Brother    Congestive Heart Failure Maternal Grandmother    Diabetes Paternal Grandmother    Dementia Paternal Grandfather    Thyroid disease Brother    Breast cancer Maternal Aunt    Allergies  Allergen Reactions   Pollen Extract Other (See Comments)    Pet dander- Hives   Shellfish Allergy Hives    Patient Care Team: Lisa Knox, Lisa Bucy, MD as PCP - General (Family Medicine) Lisa Lye, MD (General Surgery)   Medications: Outpatient Medications Prior to Visit  Medication Sig   cetirizine (ZYRTEC) 10 MG tablet Take 10 mg by mouth daily as needed for allergies.   ferrous sulfate 325 (65 FE) MG tablet Take 325 mg by mouth in the morning and at bedtime.   fluticasone (FLONASE) 50 MCG/ACT nasal spray Place 2 sprays into both nostrils daily. (Patient taking differently: Place 2 sprays into both nostrils daily as needed for allergies.)   ibuprofen (ADVIL) 800 MG tablet Take 1 tablet (800 mg total) by mouth every 8 (eight) hours as needed.   lamoTRIgine (LAMICTAL) 100 MG tablet Take 1 tablet (100 mg total) by mouth at bedtime.   Multiple Vitamin (MULTIVITAMIN WITH MINERALS) TABS tablet Take 1 tablet by mouth in the morning. Women's One A Day   No facility-administered medications prior to visit.    Review of Systems  All other systems reviewed and are negative.   Last CBC Lab Results  Component Value Date   WBC 3.0 (L) 04/16/2021   HGB 13.4 04/16/2021   HCT 40.8 04/16/2021   MCV 92.5 04/16/2021   MCH 30.4 04/16/2021   RDW 13.3 04/16/2021   PLT 245 36/64/4034   Last metabolic panel Lab Results  Component Value Date   GLUCOSE 100 (H) 04/16/2021   NA 135 04/16/2021   K 3.3 (L) 06/28/2021   CL 103 04/16/2021   CO2 26 04/16/2021   BUN 11 04/16/2021   CREATININE 0.77 04/16/2021   GFRNONAA >60 04/16/2021   CALCIUM 8.5 (L) 04/16/2021   PROT 8.5 12/11/2020   ALBUMIN 4.4 12/11/2020   LABGLOB 4.1 12/11/2020   AGRATIO 1.1 (L) 12/11/2020   BILITOT 0.7 12/11/2020   ALKPHOS 72 12/11/2020   AST 21 12/11/2020   ALT 24 12/11/2020   ANIONGAP 6 04/16/2021   Last lipids Lab Results  Component Value Date   CHOL 164 12/11/2020   HDL 70 12/11/2020   LDLCALC 85 12/11/2020   TRIG 42 12/11/2020   CHOLHDL 2.3 12/11/2020   Last thyroid functions Lab Results  Component Value Date   TSH 1.570 10/04/2014      Objective    BP (!) 152/96 (BP Location: Left  Arm, Patient Position: Sitting, Cuff Size: Large)   Pulse 93   Temp 98 F (36.7 C) (Oral)   Resp 16   Ht _0  (1.676 m)   Wt 169 lb (76.7 kg)   LMP 12/03/2021 (Exact Date)   BMI 27.28 kg/m  BP Readings from Last 3 Encounters:  12/14/21 (!) 152/96  08/21/21 (!) 155/96  07/19/21 (!) 156/99   Wt  Readings from Last 3 Encounters:  12/14/21 169 lb (76.7 kg)  08/21/21 165 lb 12.8 oz (75.2 kg)  07/19/21 162 lb 3.2 oz (73.6 kg)      Physical Exam Vitals reviewed.  Constitutional:      General: She is not in acute distress.    Appearance: Normal appearance. She is well-developed. She is not diaphoretic.  HENT:     Head: Normocephalic and atraumatic.     Right Ear: Tympanic membrane, ear canal and external ear normal.     Left Ear: Tympanic membrane, ear canal and external ear normal.     Nose: Nose normal.     Mouth/Throat:     Mouth: Mucous membranes are moist.     Pharynx: Oropharynx is clear. No oropharyngeal exudate.  Eyes:     General: No scleral icterus.    Conjunctiva/sclera: Conjunctivae normal.     Pupils: Pupils are equal, round, and reactive to light.  Neck:     Thyroid: No thyromegaly.  Cardiovascular:     Rate and Rhythm: Normal rate and regular rhythm.     Pulses: Normal pulses.     Heart sounds: Normal heart sounds. No murmur heard. Pulmonary:     Effort: Pulmonary effort is normal. No respiratory distress.     Breath sounds: Normal breath sounds. No wheezing or rales.  Abdominal:     General: There is no distension.     Palpations: Abdomen is soft.     Tenderness: There is no abdominal tenderness.  Musculoskeletal:        General: No deformity.     Cervical back: Neck supple.     Right lower leg: No edema.     Left lower leg: No edema.  Lymphadenopathy:     Cervical: No cervical adenopathy.  Skin:    General: Skin is warm and dry.     Findings: No rash.  Neurological:     Mental Status: She is alert and oriented to person, place, and time. Mental  status is at baseline.     Gait: Gait normal.  Psychiatric:        Mood and Affect: Mood normal.        Behavior: Behavior normal.        Thought Content: Thought content normal.      Last depression screening scores    12/14/2021    8:46 AM 12/11/2020   10:10 AM 08/25/2020    9:00 AM  PHQ 2/9 Scores  PHQ - 2 Score 0 0 0  PHQ- 9 Score 0 0 0   Last fall risk screening    12/14/2021    8:46 AM  Fall Risk   Falls in the past year? 0  Number falls in past yr: 0  Injury with Fall? 0  Risk for fall due to : No Fall Risks  Follow up Falls evaluation completed   Last Audit-C alcohol use screening    12/14/2021    8:46 AM  Alcohol Use Disorder Test (AUDIT)  1. How often do you have a drink containing alcohol? 2  2. How many drinks containing alcohol do you have on a typical day when you are drinking? 0  3. How often do you have six or more drinks on one occasion? 0  AUDIT-C Score 2   A score of 3 or more in women, and 4 or more in men indicates increased risk for alcohol abuse, EXCEPT if all of the points are from question 1  No results found for any visits on 12/14/21.  Assessment & Plan    Routine Health Maintenance and Physical Exam  Exercise Activities and Dietary recommendations  Goals   None     Immunization History  Administered Date(s) Administered   Hepatitis B, PED/ADOLESCENT 08/04/1996, 11/08/1996, 04/08/1997   Influenza,inj,Quad PF,6+ Mos 01/30/2015, 01/13/2017, 01/14/2018, 10/15/2018, 12/06/2019, 12/11/2020, 12/14/2021   MMR 08/04/1996   PFIZER(Purple Top)SARS-COV-2 Vaccination 04/13/2019, 05/12/2019, 01/05/2020   Pfizer Covid-19 Vaccine Bivalent Booster 19yr & up 11/23/2020   Td 09/23/1995   Tdap 10/28/2011, 12/14/2021   Typhoid Live 01/17/1999   Varicella 01/17/1999   Yellow Fever 01/18/1999    Health Maintenance  Topic Date Due   PAP SMEAR-Modifier  12/06/2022   TETANUS/TDAP  12/15/2031   INFLUENZA VACCINE  Completed   COVID-19 Vaccine   Completed   Hepatitis C Screening  Completed   HIV Screening  Completed   HPV VACCINES  Aged Out    Discussed health benefits of physical activity, and encouraged her to engage in regular exercise appropriate for her age and condition.  Problem List Items Addressed This Visit       Cardiovascular and Mediastinum   Hypertension    Chronic and uncontrolled Previously well controlled lifestyle management Elevated today Elevated on home readings as well Discussed options for medications Start HCTZ 12.5 mg daily Recheck metabolic panel Follow-up in 1 month and consider dose titration      Relevant Medications   hydrochlorothiazide (HYDRODIURIL) 12.5 MG tablet   Other Relevant Orders   Comprehensive metabolic panel   Lipid Panel With LDL/HDL Ratio     Other   Affective bipolar disorder (HCC)    Chronic and well-controlled Continue Lamictal at current dose      LGSIL on Pap smear of cervix    History of LG SIL Pap status post colposcopy Needs repeat Pap in 04/2022 We will reestablish with GYN      Relevant Orders   Ambulatory referral to Gynecology   Other Visit Diagnoses     Annual physical exam    -  Primary   Relevant Orders   Comprehensive metabolic panel   Lipid Panel With LDL/HDL Ratio   Hemoglobin A1c   Hyperglycemia       Relevant Orders   Hemoglobin A1c   Encounter for screening mammogram for malignant neoplasm of breast       Relevant Orders   MM 3D SCREEN BREAST BILATERAL   Need for Tdap vaccination       Relevant Orders   Tdap vaccine greater than or equal to 7yo IM (Completed)   Need for influenza vaccination       Relevant Orders   Flu Vaccine QUAD 623moM (Fluarix, Fluzone & Alfiuria Quad PF) (Completed)        Return in about 4 weeks (around 01/11/2022) for BP f/u, virtual ok.     I, AnLavon PaganiniMD, have reviewed all documentation for this visit. The documentation on 12/14/21 for the exam, diagnosis, procedures, and orders are all  accurate and complete.   Zinnia Tindall, AnDionne BucyMD, MPH [facility]

## 2021-12-14 ENCOUNTER — Ambulatory Visit (INDEPENDENT_AMBULATORY_CARE_PROVIDER_SITE_OTHER): Payer: BC Managed Care – PPO | Admitting: Family Medicine

## 2021-12-14 ENCOUNTER — Encounter: Payer: Self-pay | Admitting: Family Medicine

## 2021-12-14 VITALS — BP 152/96 | HR 93 | Temp 98.0°F | Resp 16 | Ht 66.0 in | Wt 169.0 lb

## 2021-12-14 DIAGNOSIS — I10 Essential (primary) hypertension: Secondary | ICD-10-CM | POA: Diagnosis not present

## 2021-12-14 DIAGNOSIS — R739 Hyperglycemia, unspecified: Secondary | ICD-10-CM | POA: Diagnosis not present

## 2021-12-14 DIAGNOSIS — Z1231 Encounter for screening mammogram for malignant neoplasm of breast: Secondary | ICD-10-CM

## 2021-12-14 DIAGNOSIS — F3170 Bipolar disorder, currently in remission, most recent episode unspecified: Secondary | ICD-10-CM | POA: Diagnosis not present

## 2021-12-14 DIAGNOSIS — Z0000 Encounter for general adult medical examination without abnormal findings: Secondary | ICD-10-CM | POA: Diagnosis not present

## 2021-12-14 DIAGNOSIS — Z23 Encounter for immunization: Secondary | ICD-10-CM

## 2021-12-14 DIAGNOSIS — R87612 Low grade squamous intraepithelial lesion on cytologic smear of cervix (LGSIL): Secondary | ICD-10-CM | POA: Diagnosis not present

## 2021-12-14 MED ORDER — HYDROCHLOROTHIAZIDE 12.5 MG PO TABS: 12.5000 mg | ORAL_TABLET | Freq: Every day | ORAL | 3 refills | Status: DC

## 2021-12-14 NOTE — Assessment & Plan Note (Signed)
Chronic and well-controlled Continue Lamictal at current dose

## 2021-12-14 NOTE — Patient Instructions (Signed)
Please call [facility] to schedule your annual routine mammogram [phone]  

## 2021-12-14 NOTE — Assessment & Plan Note (Signed)
History of LG SIL Pap status post colposcopy Needs repeat Pap in 04/2022 We will reestablish with GYN

## 2021-12-14 NOTE — Assessment & Plan Note (Signed)
Chronic and uncontrolled Previously well controlled lifestyle management Elevated today Elevated on home readings as well Discussed options for medications Start HCTZ 12.5 mg daily Recheck metabolic panel Follow-up in 1 month and consider dose titration

## 2021-12-29 LAB — LIPID PANEL WITH LDL/HDL RATIO
Cholesterol, Total: 182 mg/dL (ref 100–199)
HDL: 62 mg/dL (ref >39)
LDL Chol Calc (NIH): 107 mg/dL — ABNORMAL HIGH (ref 0–99)
LDL/HDL Ratio: 1.7 ratio (ref 0.0–3.2)
Triglycerides: 68 mg/dL (ref 0–149)
VLDL Cholesterol Cal: 13 mg/dL (ref 5–40)

## 2021-12-29 LAB — COMPREHENSIVE METABOLIC PANEL
ALT: 67 IU/L — ABNORMAL HIGH (ref 0–32)
AST: 55 IU/L — ABNORMAL HIGH (ref 0–40)
Albumin/Globulin Ratio: 1 — ABNORMAL LOW (ref 1.2–2.2)
Albumin: 4.4 g/dL (ref 3.9–4.9)
Alkaline Phosphatase: 73 IU/L (ref 44–121)
BUN/Creatinine Ratio: 12 (ref 9–23)
BUN: 11 mg/dL (ref 6–24)
Bilirubin Total: 1.2 mg/dL (ref 0.0–1.2)
CO2: 21 mmol/L (ref 20–29)
Calcium: 9.4 mg/dL (ref 8.7–10.2)
Chloride: 99 mmol/L (ref 96–106)
Creatinine, Ser: 0.93 mg/dL (ref 0.57–1.00)
Globulin, Total: 4.3 g/dL (ref 1.5–4.5)
Glucose: 80 mg/dL (ref 70–99)
Potassium: 3.9 mmol/L (ref 3.5–5.2)
Sodium: 142 mmol/L (ref 134–144)
Total Protein: 8.7 g/dL — ABNORMAL HIGH (ref 6.0–8.5)
eGFR: 78 mL/min/{1.73_m2} (ref >59)

## 2021-12-29 LAB — HEMOGLOBIN A1C
Est. average glucose Bld gHb Est-mCnc: 97 mg/dL
Hgb A1c MFr Bld: 5 % (ref 4.8–5.6)

## 2022-01-11 NOTE — Progress Notes (Deleted)
      Established patient visit   Patient: Lisa Knox   DOB: 1980/02/22   41 y.o. Female  MRN: [id number] Visit Date: 01/14/2022  Today's healthcare provider: [name], MD   No chief complaint on file.  Subjective    HPI  Follow up for hypertension  The patient was last seen for this 1 months ago. Changes made at last visit include start hctz 12.5 mg daily.  She reports {excellent/good/fair/poor:19665} compliance with treatment. She feels that condition is {improved/worse/unchanged:3041574}. She {is/is not:21021397} having side effects. ***  -----------------------------------------------------------------------------------------   Medications: Outpatient Medications Prior to Visit  Medication Sig   cetirizine (ZYRTEC) 10 MG tablet Take 10 mg by mouth daily as needed for allergies.   ferrous sulfate 325 (65 FE) MG tablet Take 325 mg by mouth in the morning and at bedtime.   fluticasone (FLONASE) 50 MCG/ACT nasal spray Place 2 sprays into both nostrils daily. (Patient taking differently: Place 2 sprays into both nostrils daily as needed for allergies.)   hydrochlorothiazide (HYDRODIURIL) 12.5 MG tablet Take 1 tablet (12.5 mg total) by mouth daily.   ibuprofen (ADVIL) 800 MG tablet Take 1 tablet (800 mg total) by mouth every 8 (eight) hours as needed.   lamoTRIgine (LAMICTAL) 100 MG tablet Take 1 tablet (100 mg total) by mouth at bedtime.   Multiple Vitamin (MULTIVITAMIN WITH MINERALS) TABS tablet Take 1 tablet by mouth in the morning. Women's One A Day   No facility-administered medications prior to visit.    Review of Systems  {Labs  Heme  Chem  Endocrine  Serology  Results Review (optional):23779}   Objective    There were no vitals taken for this visit. {Show previous vital signs (optional):23777}  Physical Exam  ***  No results found for any visits on 01/14/22.  Assessment & Plan     ***  No follow-ups on file.      {provider  attestation***:1}   [name], MD  [facility] [phone] (phone) [phone] (fax)  [place]

## 2022-01-14 ENCOUNTER — Telehealth: Payer: BC Managed Care – PPO | Admitting: Family Medicine

## 2022-01-14 ENCOUNTER — Encounter: Payer: Self-pay | Admitting: Family Medicine

## 2022-01-14 ENCOUNTER — Ambulatory Visit: Payer: BC Managed Care – PPO | Admitting: Family Medicine

## 2022-01-14 VITALS — BP 132/102

## 2022-01-14 DIAGNOSIS — I10 Essential (primary) hypertension: Secondary | ICD-10-CM

## 2022-01-14 DIAGNOSIS — R7989 Other specified abnormal findings of blood chemistry: Secondary | ICD-10-CM

## 2022-01-14 MED ORDER — HYDROCHLOROTHIAZIDE 25 MG PO TABS: 25.0000 mg | ORAL_TABLET | Freq: Every day | ORAL | 1 refills | Status: DC

## 2022-01-14 NOTE — Progress Notes (Signed)
I,Sulibeya S Dimas,acting as a Education administrator for [name], MD.,have documented all relevant documentation on the behalf of [name], MD,as directed by  [name], MD while in the presence of [name], MD.   MyChart Video Visit    Virtual Visit via Video Note   This format is felt to be most appropriate for this patient at this time. Physical exam was limited by quality of the video and audio technology used for the visit.    Patient location: home Provider location: [place] involved in the visit: patient, provider   I discussed the limitations of evaluation and management by telemedicine and the availability of in person appointments. The patient expressed understanding and agreed to proceed.  Patient: Lisa Knox   DOB: 08-Feb-1981   41 y.o. Female  MRN: [id number] Visit Date: 01/14/2022  Today's healthcare provider: [name], MD   Chief Complaint  Patient presents with   Follow-up   Subjective    HPI  Follow up for hypertension  The patient was last seen for this 1 months ago. Changes made at last visit include start HCTZ 12.5 mg daily.  She reports excellent compliance with treatment. BP home readings are stable. She reports systolic numbers have improved and diastolic is still reading high 128/90-102  She feels that condition is Improved. She is not having side effects.   BP Readings from Last 3 Encounters:  01/14/22 (!) 132/102  12/14/21 (!) 152/96  08/21/21 (!) 155/96    -----------------------------------------------------------------------------------------  Medications: Outpatient Medications Prior to Visit  Medication Sig   cetirizine (ZYRTEC) 10 MG tablet Take 10 mg by mouth daily as needed for allergies.   ferrous sulfate 325 (65 FE) MG tablet Take 325 mg by mouth in the morning and at bedtime.   fluticasone (FLONASE) 50 MCG/ACT nasal spray Place 2 sprays into both nostrils daily.  (Patient taking differently: Place 2 sprays into both nostrils daily as needed for allergies.)   ibuprofen (ADVIL) 800 MG tablet Take 1 tablet (800 mg total) by mouth every 8 (eight) hours as needed.   lamoTRIgine (LAMICTAL) 100 MG tablet Take 1 tablet (100 mg total) by mouth at bedtime.   Multiple Vitamin (MULTIVITAMIN WITH MINERALS) TABS tablet Take 1 tablet by mouth in the morning. Women's One A Day   [DISCONTINUED] hydrochlorothiazide (HYDRODIURIL) 12.5 MG tablet Take 1 tablet (12.5 mg total) by mouth daily.   No facility-administered medications prior to visit.    Review of Systems  Constitutional:  Negative for appetite change and fatigue.  Eyes:  Negative for visual disturbance.  Respiratory:  Negative for shortness of breath.   Cardiovascular:  Positive for leg swelling. Negative for chest pain.  Gastrointestinal:  Negative for abdominal pain, nausea and vomiting.  Neurological:  Negative for dizziness, light-headedness and headaches.       Objective    BP (!) 132/102   BP Readings from Last 3 Encounters:  01/14/22 (!) 132/102  12/14/21 (!) 152/96  08/21/21 (!) 155/96   Wt Readings from Last 3 Encounters:  12/14/21 169 lb (76.7 kg)  08/21/21 165 lb 12.8 oz (75.2 kg)  07/19/21 162 lb 3.2 oz (73.6 kg)      Physical Exam Constitutional:      General: She is not in acute distress.    Appearance: Normal appearance.  HENT:     Head: Normocephalic.  Pulmonary:     Effort: Pulmonary effort is normal. No respiratory distress.  Neurological:     Mental  Status: She is alert and oriented to person, place, and time. Mental status is at baseline.        Assessment & Plan     Problem List Items Addressed This Visit       Cardiovascular and Mediastinum   Hypertension - Primary    Chronic and uncontrolled Remains elevated but improving Increase HCTZ to 25 mg daily Recheck CMP F/u in 1 m      Relevant Medications   hydrochlorothiazide (HYDRODIURIL) 25 MG tablet    Other Relevant Orders   Comprehensive metabolic panel     Other   Elevated LFTs    Mild New problem noted on last labs May be related to dehydration Hydrate well and repeat labs      Relevant Orders   Comprehensive metabolic panel     Return in about 4 weeks (around 02/11/2022) for BP f/u, virtual ok.     I discussed the assessment and treatment plan with the patient. The patient was provided an opportunity to ask questions and all were answered. The patient agreed with the plan and demonstrated an understanding of the instructions.   The patient was advised to call back or seek an in-person evaluation if the symptoms worsen or if the condition fails to improve as anticipated.    I, [name], MD, have reviewed all documentation for this visit. The documentation on 01/14/22 for the exam, diagnosis, procedures, and orders are all accurate and complete.   Ilean Spradlin, Dionne Bucy, MD, MPH [facility]

## 2022-01-14 NOTE — Assessment & Plan Note (Signed)
Chronic and uncontrolled Remains elevated but improving Increase HCTZ to 25 mg daily Recheck CMP F/u in 1 m

## 2022-01-14 NOTE — Assessment & Plan Note (Signed)
Mild New problem noted on last labs May be related to dehydration Hydrate well and repeat labs

## 2022-02-08 NOTE — Progress Notes (Unsigned)
MyChart Video Visit    Virtual Visit via Video Note   This format is felt to be most appropriate for this patient at this time. Physical exam was limited by quality of the video and audio technology used for the visit.   Patient location: *** Provider location: ***  I discussed the limitations of evaluation and management by telemedicine and the availability of in person appointments. The patient expressed understanding and agreed to proceed.  Patient: Lisa Knox   DOB: 1980-08-29   41 y.o. Female  MRN: [id number] Visit Date: 02/14/2022  Today's healthcare provider: [name], MD   No chief complaint on file.  Subjective    HPI  Hypertension, follow-up  BP Readings from Last 3 Encounters:  01/14/22 (!) 132/102  12/14/21 (!) 152/96  08/21/21 (!) 155/96   Wt Readings from Last 3 Encounters:  12/14/21 169 lb (76.7 kg)  08/21/21 165 lb 12.8 oz (75.2 kg)  07/19/21 162 lb 3.2 oz (73.6 kg)     She was last seen for hypertension {NUMBERS 1-12:18279} {days/wks/mos/yrs:310907} ago.  BP at that visit was ***. Management since that visit includes ***.  She reports {excellent/good/fair/poor:19665} compliance with treatment. She {is/is not:9024} having side effects. {document side effects if present:1}  Outside blood pressures are {***enter patient reported home BP readings, or 'not being checked':1}.  Pertinent labs Lab Results  Component Value Date   CHOL 182 12/28/2021   HDL 62 12/28/2021   LDLCALC 107 (H) 12/28/2021   TRIG 68 12/28/2021   CHOLHDL 2.3 12/11/2020   Lab Results  Component Value Date   NA 142 12/28/2021   K 3.9 12/28/2021   CREATININE 0.93 12/28/2021   EGFR 78 12/28/2021   GLUCOSE 80 12/28/2021   TSH 1.570 10/04/2014     The 10-year ASCVD risk score (Arnett DK, et al., 2019) is: 1.6%  ---------------------------------------------------------------------------------------------------    Medications: Outpatient Medications Prior to  Visit  Medication Sig   cetirizine (ZYRTEC) 10 MG tablet Take 10 mg by mouth daily as needed for allergies.   ferrous sulfate 325 (65 FE) MG tablet Take 325 mg by mouth in the morning and at bedtime.   fluticasone (FLONASE) 50 MCG/ACT nasal spray Place 2 sprays into both nostrils daily. (Patient taking differently: Place 2 sprays into both nostrils daily as needed for allergies.)   hydrochlorothiazide (HYDRODIURIL) 25 MG tablet Take 1 tablet (25 mg total) by mouth daily.   ibuprofen (ADVIL) 800 MG tablet Take 1 tablet (800 mg total) by mouth every 8 (eight) hours as needed.   lamoTRIgine (LAMICTAL) 100 MG tablet Take 1 tablet (100 mg total) by mouth at bedtime.   Multiple Vitamin (MULTIVITAMIN WITH MINERALS) TABS tablet Take 1 tablet by mouth in the morning. Women's One A Day   No facility-administered medications prior to visit.    Review of Systems  Constitutional:  Negative for appetite change and fatigue.  Eyes:  Negative for visual disturbance.  Respiratory:  Negative for chest tightness and shortness of breath.   Cardiovascular:  Negative for chest pain and leg swelling.  Gastrointestinal:  Negative for abdominal pain, nausea and vomiting.  Neurological:  Negative for dizziness, light-headedness and headaches.    {Labs  Heme  Chem  Endocrine  Serology  Results Review (optional):23779}   Objective    There were no vitals taken for this visit.  {Show previous vital signs (optional):23777}   Physical Exam     Assessment & Plan     ***  No follow-ups on file.     I discussed the assessment and treatment plan with the patient. The patient was provided an opportunity to ask questions and all were answered. The patient agreed with the plan and demonstrated an understanding of the instructions.   The patient was advised to call back or seek an in-person evaluation if the symptoms worsen or if the condition fails to improve as anticipated.  I provided *** minutes of  non-face-to-face time during this encounter.  {provider attestation***:1}  [name], MD [facility] [phone] (phone) [phone] (fax)  [place]

## 2022-02-14 ENCOUNTER — Telehealth: Payer: BC Managed Care – PPO | Admitting: Family Medicine

## 2022-02-14 ENCOUNTER — Encounter: Payer: Self-pay | Admitting: Family Medicine

## 2022-02-14 VITALS — BP 129/84 | HR 90

## 2022-02-14 DIAGNOSIS — I10 Essential (primary) hypertension: Secondary | ICD-10-CM | POA: Diagnosis not present

## 2022-02-14 DIAGNOSIS — R7989 Other specified abnormal findings of blood chemistry: Secondary | ICD-10-CM

## 2022-02-14 NOTE — Assessment & Plan Note (Signed)
Well controlled Continue current medications Recheck metabolic panel - ordered at last visit

## 2022-02-14 NOTE — Assessment & Plan Note (Signed)
Recheck labs as ordered at last visit

## 2022-02-16 LAB — COMPREHENSIVE METABOLIC PANEL
ALT: 124 IU/L — ABNORMAL HIGH (ref 0–32)
AST: 72 IU/L — ABNORMAL HIGH (ref 0–40)
Albumin/Globulin Ratio: 0.9 — ABNORMAL LOW (ref 1.2–2.2)
Albumin: 4.3 g/dL (ref 3.9–4.9)
Alkaline Phosphatase: 81 IU/L (ref 44–121)
BUN/Creatinine Ratio: 11 (ref 9–23)
BUN: 11 mg/dL (ref 6–24)
Bilirubin Total: 0.7 mg/dL (ref 0.0–1.2)
CO2: 29 mmol/L (ref 20–29)
Calcium: 9.4 mg/dL (ref 8.7–10.2)
Chloride: 98 mmol/L (ref 96–106)
Creatinine, Ser: 1 mg/dL (ref 0.57–1.00)
Globulin, Total: 4.6 g/dL — ABNORMAL HIGH (ref 1.5–4.5)
Glucose: 101 mg/dL — ABNORMAL HIGH (ref 70–99)
Potassium: 3.7 mmol/L (ref 3.5–5.2)
Sodium: 140 mmol/L (ref 134–144)
Total Protein: 8.9 g/dL — ABNORMAL HIGH (ref 6.0–8.5)
eGFR: 72 mL/min/{1.73_m2} (ref >59)

## 2022-02-18 ENCOUNTER — Other Ambulatory Visit: Payer: Self-pay

## 2022-02-18 DIAGNOSIS — R7989 Other specified abnormal findings of blood chemistry: Secondary | ICD-10-CM

## 2022-02-25 ENCOUNTER — Ambulatory Visit
Admission: RE | Admit: 2022-02-25 | Discharge: 2022-02-25 | Disposition: A | Discharge status: Home / Self Care | Payer: BC Managed Care – PPO | Source: Ambulatory Visit | Attending: Family Medicine | Admitting: Family Medicine

## 2022-02-25 DIAGNOSIS — R7989 Other specified abnormal findings of blood chemistry: Secondary | ICD-10-CM | POA: Insufficient documentation

## 2022-02-26 LAB — HEPATIC FUNCTION PANEL
ALT: 99 IU/L — ABNORMAL HIGH (ref 0–32)
AST: 65 IU/L — ABNORMAL HIGH (ref 0–40)
Albumin: 4 g/dL (ref 3.9–4.9)
Alkaline Phosphatase: 80 IU/L (ref 44–121)
Bilirubin Total: 0.7 mg/dL (ref 0.0–1.2)
Bilirubin, Direct: 0.22 mg/dL (ref 0.00–0.40)
Total Protein: 8.7 g/dL — ABNORMAL HIGH (ref 6.0–8.5)

## 2022-02-26 LAB — HEPATITIS B SURFACE ANTIGEN: Hepatitis B Surface Ag: NEGATIVE

## 2022-02-26 LAB — HEPATITIS B SURFACE ANTIBODY,QUALITATIVE: Hep B Surface Ab, Qual: REACTIVE

## 2022-02-26 LAB — HEPATITIS B CORE AB W/REFLEX: Hep B Core Total Ab: NEGATIVE

## 2022-02-26 LAB — HEPATITIS C ANTIBODY: Hep C Virus Ab: NONREACTIVE

## 2022-02-27 ENCOUNTER — Other Ambulatory Visit: Payer: Self-pay

## 2022-02-27 DIAGNOSIS — R7989 Other specified abnormal findings of blood chemistry: Secondary | ICD-10-CM

## 2022-04-22 NOTE — Progress Notes (Unsigned)
    GYNECOLOGY PROGRESS NOTE  Subjective:    Patient ID: Lisa Knox, female    DOB: [birth year], 42 y.o.   MRN: [id number]  HPI  Patient is a 42 y.o. G0P0000 female who presents for repeat pap smear. She had an abnormal pap smear done on 12/09/2019 and it resulted as LSIL with Negative HPV. She had colposcopy done on 04/17/2021 performed by Dr. Adrian Prows of [place] and it resulted as:    DIAGNOSIS:  A. UTERINE CERVIX, 6:00; BIOPSY:  - BENIGN ECTOCERVICAL SQUAMOUS MUCOSA.  - NEGATIVE FOR SQUAMOUS INTRAEPITHELIAL LESION AND MALIGNANCY.   B. UTERINE CERVIX, 12:00; BIOPSY:  - BENIGN ECTOCERVICAL SQUAMOUS MUCOSA.  - NEGATIVE FOR SQUAMOUS INTRAEPITHELIAL LESION AND MALIGNANCY.   C. ENDOCERVIX; CURETTAGE:  - SUPERFICIAL FRAGMENTS OF BENIGN ENDO- AND ECTOCERVICAL TISSUE.  - NEGATIVE FOR DYSPLASIA AND MALIGNANCY.    The following portions of the patient's history were reviewed and updated as appropriate:   She  has a past medical history of Anal fissure (02/12/1999), Anemia, Condyloma, Depression, Headache, Hemorrhoids, and Hypertension.  She  has a past surgical history that includes Wisdom tooth extraction (02/12/2000); Sigmoidoscopy (02/12/1999); Breast biopsy (Left); Colposcopy (N/A, 04/17/2021); and Condyloma excision/fulguration (N/A, 07/06/2021).  She  reports that she has never smoked. She has never used smokeless tobacco. She reports current alcohol use. She reports that she does not use drugs.  Current Outpatient Medications on File Prior to Visit  Medication Sig Dispense Refill   cetirizine (ZYRTEC) 10 MG tablet Take 10 mg by mouth daily as needed for allergies.     ferrous sulfate 325 (65 FE) MG tablet Take 325 mg by mouth in the morning and at bedtime.     fluticasone (FLONASE) 50 MCG/ACT nasal spray Place 2 sprays into both nostrils daily. (Patient taking differently: Place 2 sprays into both nostrils daily as needed for allergies.) 48 g 3    hydrochlorothiazide (HYDRODIURIL) 25 MG tablet Take 1 tablet (25 mg total) by mouth daily. 90 tablet 1   ibuprofen (ADVIL) 800 MG tablet Take 1 tablet (800 mg total) by mouth every 8 (eight) hours as needed. 30 tablet 0   lamoTRIgine (LAMICTAL) 100 MG tablet Take 1 tablet (100 mg total) by mouth at bedtime. 60 tablet 5   Multiple Vitamin (MULTIVITAMIN WITH MINERALS) TABS tablet Take 1 tablet by mouth in the morning. Women's One A Day     No current facility-administered medications on file prior to visit.   She is allergic to pollen extract and shellfish allergy..  Review of Systems A comprehensive review of systems was negative.   Objective:   Blood pressure (!) 141/94, pulse 99, resp. rate 16, height 5\' 6"  (1.676 m), weight 167 lb 6.4 oz (75.9 kg), last menstrual period 04/08/2022. Body mass index is 27.02 kg/m. General appearance: alert and no distress Pelvic: external genitalia with right labial swelling at the base, also noted condyloma site. Vaginal with scant thin white discharge. Cervix displaced posteriorly and to the left (h/o fibroids), no lesions. Bimanual exam not    Assessment:   1. History of abnormal cervical Papanicolaou smear   2. Condyloma acuminatum of vulva      Plan:  Follow up in 1 year, or as indicated by Pap results. Condyloma of the vulva, briefly discussed options for management if desired.     Rubie Maid, MD [place]

## 2022-04-23 ENCOUNTER — Other Ambulatory Visit (HOSPITAL_COMMUNITY)
Admission: RE | Admit: 2022-04-23 | Discharge: 2022-04-23 | Disposition: A | Discharge status: Home / Self Care | Payer: BC Managed Care – PPO | Source: Ambulatory Visit | Attending: Obstetrics and Gynecology | Admitting: Obstetrics and Gynecology

## 2022-04-23 ENCOUNTER — Ambulatory Visit (INDEPENDENT_AMBULATORY_CARE_PROVIDER_SITE_OTHER): Payer: BC Managed Care – PPO | Admitting: Obstetrics and Gynecology

## 2022-04-23 ENCOUNTER — Encounter: Payer: Self-pay | Admitting: Obstetrics and Gynecology

## 2022-04-23 VITALS — BP 141/94 | HR 99 | Resp 16 | Ht 66.0 in | Wt 167.4 lb

## 2022-04-23 DIAGNOSIS — Z8742 Personal history of other diseases of the female genital tract: Secondary | ICD-10-CM

## 2022-04-23 DIAGNOSIS — A630 Anogenital (venereal) warts: Secondary | ICD-10-CM | POA: Diagnosis not present

## 2022-04-24 LAB — HEPATIC FUNCTION PANEL
ALT: 80 IU/L — ABNORMAL HIGH (ref 0–32)
AST: 54 IU/L — ABNORMAL HIGH (ref 0–40)
Albumin: 4.2 g/dL (ref 3.9–4.9)
Alkaline Phosphatase: 81 IU/L (ref 44–121)
Bilirubin Total: 0.8 mg/dL (ref 0.0–1.2)
Bilirubin, Direct: 0.25 mg/dL (ref 0.00–0.40)
Total Protein: 8.9 g/dL — ABNORMAL HIGH (ref 6.0–8.5)

## 2022-04-25 ENCOUNTER — Other Ambulatory Visit: Payer: Self-pay

## 2022-04-25 DIAGNOSIS — R7989 Other specified abnormal findings of blood chemistry: Secondary | ICD-10-CM

## 2022-04-25 NOTE — Progress Notes (Signed)
Hepatic function panel ordered for repeat in 2 months.

## 2022-04-29 LAB — CYTOLOGY - PAP
Adequacy: ABSENT
Diagnosis: NEGATIVE

## 2022-06-13 ENCOUNTER — Telehealth: Payer: Self-pay | Admitting: Family Medicine

## 2022-06-13 ENCOUNTER — Telehealth: Payer: BC Managed Care – PPO | Admitting: Family Medicine

## 2022-06-14 ENCOUNTER — Telehealth: Payer: BC Managed Care – PPO | Admitting: Family Medicine

## 2022-06-20 ENCOUNTER — Telehealth (INDEPENDENT_AMBULATORY_CARE_PROVIDER_SITE_OTHER): Payer: BC Managed Care – PPO | Admitting: Family Medicine

## 2022-06-20 VITALS — BP 130/82

## 2022-06-20 DIAGNOSIS — I10 Essential (primary) hypertension: Secondary | ICD-10-CM | POA: Diagnosis not present

## 2022-06-20 DIAGNOSIS — R7989 Other specified abnormal findings of blood chemistry: Secondary | ICD-10-CM | POA: Diagnosis not present

## 2022-06-20 NOTE — Progress Notes (Signed)
MyChart Video Visit    Virtual Visit via Video Note   This format is felt to be most appropriate for this patient at this time. Physical exam was limited by quality of the video and audio technology used for the visit.    Patient location: home Provider location: [facility] Persons involved in the visit: patient, provider   I discussed the limitations of evaluation and management by telemedicine and the availability of in person appointments. The patient expressed understanding and agreed to proceed.  Patient: Lisa Knox   DOB: 06/18/1980   42 y.o. Female  MRN: [id number] Visit Date: 06/20/2022  Today's healthcare provider: Shirlee Latch, MD   Chief Complaint  Patient presents with   Hypertension   Subjective    Hypertension     HTN - last seen 02/14/22 - BPs were well controlled BP is elevated today, but typically good -  No problems with the medication    Medications: Outpatient Medications Prior to Visit  Medication Sig   cetirizine (ZYRTEC) 10 MG tablet Take 10 mg by mouth daily as needed for allergies.   ferrous sulfate 325 (65 FE) MG tablet Take 325 mg by mouth in the morning and at bedtime.   fluticasone (FLONASE) 50 MCG/ACT nasal spray Place 2 sprays into both nostrils daily. (Patient taking differently: Place 2 sprays into both nostrils daily as needed for allergies.)   hydrochlorothiazide (HYDRODIURIL) 25 MG tablet Take 1 tablet (25 mg total) by mouth daily.   ibuprofen (ADVIL) 800 MG tablet Take 1 tablet (800 mg total) by mouth every 8 (eight) hours as needed.   lamoTRIgine (LAMICTAL) 100 MG tablet Take 1 tablet (100 mg total) by mouth at bedtime.   Multiple Vitamin (MULTIVITAMIN WITH MINERALS) TABS tablet Take 1 tablet by mouth in the morning. Women's One A Day   No facility-administered medications prior to visit.    Review of Systems per HPI     Objective    BP 130/82 Comment: home readings     Physical  Exam Constitutional:      General: She is not in acute distress.    Appearance: Normal appearance.  HENT:     Head: Normocephalic.  Pulmonary:     Effort: Pulmonary effort is normal. No respiratory distress.  Neurological:     Mental Status: She is alert and oriented to person, place, and time. Mental status is at baseline.        Assessment & Plan     Problem List Items Addressed This Visit       Cardiovascular and Mediastinum   Hypertension - Primary    Well controlled Continue current medications Recheck metabolic panel F/u in 6 months       Relevant Orders   Comprehensive metabolic panel     Other   Elevated LFTs    Recheck levels [place] And viral hepatitis testing normal with no clear etiology of elevated LFTs Mildly elevated Consider GI referral if not improving with next labs      Relevant Orders   Comprehensive metabolic panel     Return in about 6 months (around 12/21/2022) for CPE.     I discussed the assessment and treatment plan with the patient. The patient was provided an opportunity to ask questions and all were answered. The patient agreed with the plan and demonstrated an understanding of the instructions.   The patient was advised to call back or seek an in-person evaluation if the symptoms worsen or  if the condition fails to improve as anticipated.  I, Shirlee Latch, MD, have reviewed all documentation for this visit. The documentation on 06/20/22 for the exam, diagnosis, procedures, and orders are all accurate and complete.   Lisa Knox, Lisa Schlein, MD, MPH [facility]

## 2022-06-20 NOTE — Assessment & Plan Note (Signed)
Well controlled Continue current medications Recheck metabolic panel F/u in 6 months  

## 2022-06-20 NOTE — Assessment & Plan Note (Signed)
Recheck levels [place] And viral hepatitis testing normal with no clear etiology of elevated LFTs Mildly elevated Consider GI referral if not improving with next labs

## 2022-07-06 LAB — COMPREHENSIVE METABOLIC PANEL
ALT: 69 IU/L — ABNORMAL HIGH (ref 0–32)
AST: 53 IU/L — ABNORMAL HIGH (ref 0–40)
Albumin/Globulin Ratio: 1 — ABNORMAL LOW (ref 1.2–2.2)
Albumin: 3.9 g/dL (ref 3.9–4.9)
Alkaline Phosphatase: 76 IU/L (ref 44–121)
BUN/Creatinine Ratio: 14 (ref 9–23)
BUN: 13 mg/dL (ref 6–24)
Bilirubin Total: 0.8 mg/dL (ref 0.0–1.2)
CO2: 24 mmol/L (ref 20–29)
Calcium: 9 mg/dL (ref 8.7–10.2)
Chloride: 98 mmol/L (ref 96–106)
Creatinine, Ser: 0.94 mg/dL (ref 0.57–1.00)
Globulin, Total: 3.9 g/dL (ref 1.5–4.5)
Glucose: 85 mg/dL (ref 70–99)
Potassium: 3.3 mmol/L — ABNORMAL LOW (ref 3.5–5.2)
Sodium: 137 mmol/L (ref 134–144)
Total Protein: 7.8 g/dL (ref 6.0–8.5)
eGFR: 77 mL/min/{1.73_m2} (ref >59)

## 2022-07-09 ENCOUNTER — Other Ambulatory Visit: Payer: Self-pay | Admitting: Family Medicine

## 2022-07-09 DIAGNOSIS — R768 Other specified abnormal immunological findings in serum: Secondary | ICD-10-CM

## 2022-07-09 DIAGNOSIS — R748 Abnormal levels of other serum enzymes: Secondary | ICD-10-CM

## 2022-07-09 DIAGNOSIS — E876 Hypokalemia: Secondary | ICD-10-CM

## 2022-08-29 ENCOUNTER — Other Ambulatory Visit: Payer: Self-pay | Admitting: Family Medicine

## 2022-08-30 NOTE — Telephone Encounter (Signed)
Requested Prescriptions  Pending Prescriptions Disp Refills   hydrochlorothiazide (HYDRODIURIL) 25 MG tablet [Pharmacy Med Name: hydroCHLOROthiazide 25 MG Oral Tablet] 90 tablet 1    Sig: Take 1 tablet by mouth once daily     Cardiovascular: Diuretics - Thiazide Failed - 08/29/2022  2:59 PM      Failed - K in normal range and within 180 days    Potassium  Date Value Ref Range Status  07/05/2022 3.3 (L) 3.5 - 5.2 mmol/L Final         Passed - Cr in normal range and within 180 days    Creat  Date Value Ref Range Status  01/13/2017 0.99 0.50 - 1.10 mg/dL Final   Creatinine, Ser  Date Value Ref Range Status  07/05/2022 0.94 0.57 - 1.00 mg/dL Final         Passed - Na in normal range and within 180 days    Sodium  Date Value Ref Range Status  07/05/2022 137 134 - 144 mmol/L Final         Passed - Last BP in normal range    BP Readings from Last 1 Encounters:  06/20/22 130/82         Passed - Valid encounter within last 6 months    Recent Outpatient Visits           2 months ago Primary hypertension   [facility] [place], Marzella Schlein, MD   6 months ago Primary hypertension   [facility] [place], Marzella Schlein, MD   7 months ago Primary hypertension   [facility] [place], Marzella Schlein, MD   8 months ago Annual physical exam   [facility] [place], Marzella Schlein, MD   1 year ago Annual physical exam   [facility] [place], Marzella Schlein, MD

## 2022-10-03 ENCOUNTER — Other Ambulatory Visit: Payer: Self-pay | Admitting: Family Medicine

## 2022-10-03 DIAGNOSIS — F3170 Bipolar disorder, currently in remission, most recent episode unspecified: Secondary | ICD-10-CM

## 2022-10-04 NOTE — Telephone Encounter (Signed)
I believe this is filled by psych or someone else, not [place]. Can you check with patient please?

## 2022-12-05 ENCOUNTER — Other Ambulatory Visit: Payer: Self-pay | Admitting: Family Medicine

## 2022-12-05 DIAGNOSIS — F3170 Bipolar disorder, currently in remission, most recent episode unspecified: Secondary | ICD-10-CM

## 2023-01-16 ENCOUNTER — Other Ambulatory Visit: Payer: Self-pay | Admitting: Physician Assistant

## 2023-01-16 DIAGNOSIS — F3170 Bipolar disorder, currently in remission, most recent episode unspecified: Secondary | ICD-10-CM

## 2023-01-16 NOTE — Telephone Encounter (Signed)
Requested Prescriptions  Pending Prescriptions Disp Refills   lamoTRIgine (LAMICTAL) 100 MG tablet [Pharmacy Med Name: lamoTRIgine 100 MG Oral Tablet] 90 tablet 1    Sig: TAKE 1 TABLET BY MOUTH AT BEDTIME     Neurology:  Anticonvulsants - lamotrigine Failed - 01/16/2023  7:01 AM      Failed - ALT in normal range and within 360 days    ALT  Date Value Ref Range Status  07/05/2022 69 (H) 0 - 32 IU/L Final         Failed - AST in normal range and within 360 days    AST  Date Value Ref Range Status  07/05/2022 53 (H) 0 - 40 IU/L Final         Failed - Completed PHQ-2 or PHQ-9 in the last 360 days      Passed - Cr in normal range and within 360 days    Creat  Date Value Ref Range Status  01/13/2017 0.99 0.50 - 1.10 mg/dL Final   Creatinine, Ser  Date Value Ref Range Status  07/05/2022 0.94 0.57 - 1.00 mg/dL Final         Passed - Valid encounter within last 12 months    Recent Outpatient Visits           7 months ago Primary hypertension   [facility] [place], Marzella Schlein, MD   11 months ago Primary hypertension   [facility] [place], Marzella Schlein, MD   1 year ago Primary hypertension   [facility] [place], Marzella Schlein, MD   1 year ago Annual physical exam   [facility] [place], Marzella Schlein, MD   2 years ago Annual physical exam   [facility] [place], Marzella Schlein, MD

## 2023-02-05 LAB — COMPREHENSIVE METABOLIC PANEL
ALT: 71 [IU]/L — ABNORMAL HIGH (ref 0–32)
AST: 46 [IU]/L — ABNORMAL HIGH (ref 0–40)
Albumin: 4.2 g/dL (ref 3.9–4.9)
Alkaline Phosphatase: 82 [IU]/L (ref 44–121)
BUN/Creatinine Ratio: 19 (ref 9–23)
BUN: 18 mg/dL (ref 6–24)
Bilirubin Total: 0.5 mg/dL (ref 0.0–1.2)
CO2: 25 mmol/L (ref 20–29)
Calcium: 8.9 mg/dL (ref 8.7–10.2)
Chloride: 97 mmol/L (ref 96–106)
Creatinine, Ser: 0.96 mg/dL (ref 0.57–1.00)
Globulin, Total: 4 g/dL (ref 1.5–4.5)
Glucose: 85 mg/dL (ref 70–99)
Potassium: 3.9 mmol/L (ref 3.5–5.2)
Sodium: 137 mmol/L (ref 134–144)
Total Protein: 8.2 g/dL (ref 6.0–8.5)
eGFR: 75 mL/min/{1.73_m2} (ref >59)

## 2023-02-05 LAB — HEPATITIS B CORE AB W/REFLEX: Hep B Core Total Ab: NEGATIVE

## 2023-02-05 LAB — TSH: TSH: 1.56 u[IU]/mL (ref 0.450–4.500)

## 2023-02-05 LAB — HEPATITIS B SURFACE ANTIBODY,QUALITATIVE: Hep B Surface Ab, Qual: REACTIVE

## 2023-02-05 LAB — CELIAC DISEASE PANEL
IgA/Immunoglobulin A, Serum: 474 mg/dL — ABNORMAL HIGH (ref 87–352)
Transglutaminase IgA: <2 U/mL (ref 0–3)

## 2023-02-05 LAB — ANA W/REFLEX IF POSITIVE: Anti Nuclear Antibody (ANA): NEGATIVE

## 2023-02-05 LAB — HEPATITIS C ANTIBODY: Hep C Virus Ab: NONREACTIVE

## 2023-02-05 LAB — HEPATITIS A ANTIBODY, TOTAL: hep A Total Ab: POSITIVE — AB

## 2023-02-05 LAB — ANTI-SMOOTH MUSCLE ANTIBODY, IGG: Smooth Muscle Ab: 12 U (ref 0–19)

## 2023-02-05 LAB — HEPATITIS B SURFACE ANTIGEN: Hepatitis B Surface Ag: NEGATIVE

## 2023-02-06 IMAGING — US US BREAST*L* LIMITED INC AXILLA
1 series · 6 of 6 positions shown · non-contrast
Comparison: Previous exams including recent screening mammogram
dated 12/19/2020.

CLINICAL DATA: Patient returns today to evaluate a possible LEFT
breast asymmetry questioned on recent screening mammogram.

EXAM:
DIGITAL DIAGNOSTIC UNILATERAL LEFT MAMMOGRAM WITH TOMOSYNTHESIS AND
CAD; ULTRASOUND LEFT BREAST LIMITED
TECHNIQUE: Left digital diagnostic mammography and breast tomosynthesis was
performed. The images were evaluated with computer-aided detection.;
Targeted ultrasound examination of the left breast was performed.

[Series 1: us breast*left* limited inc axilla · 0.06mm/px · 6 of 6 slices shown]
[im 1/6]
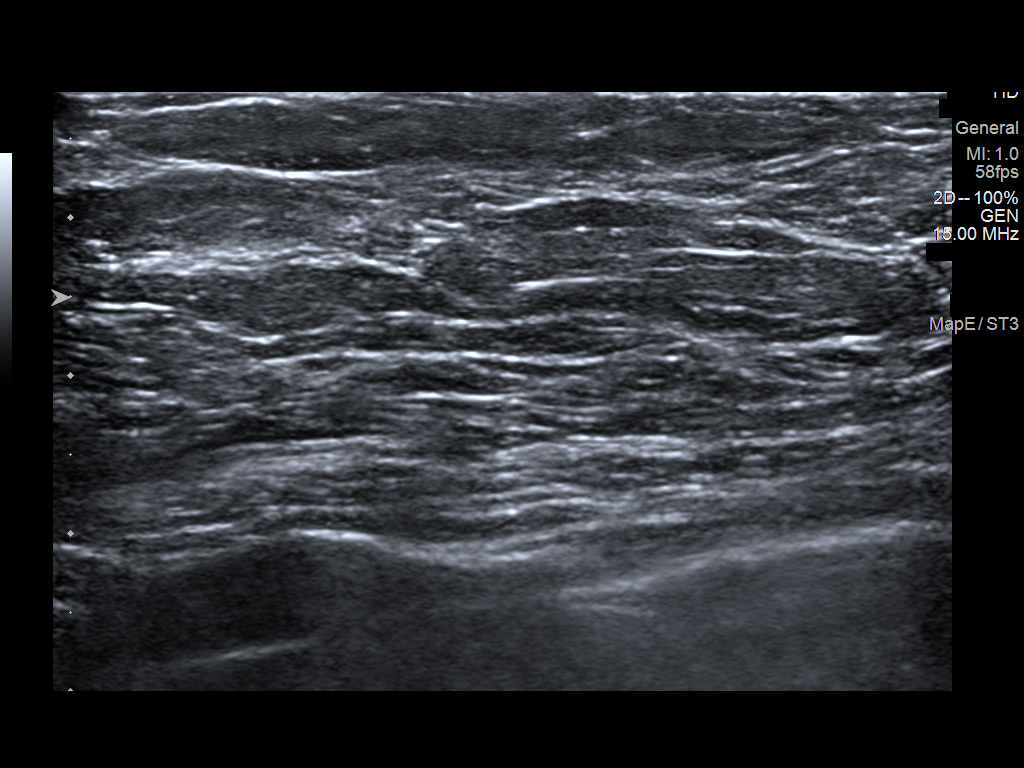
[im 2/6]
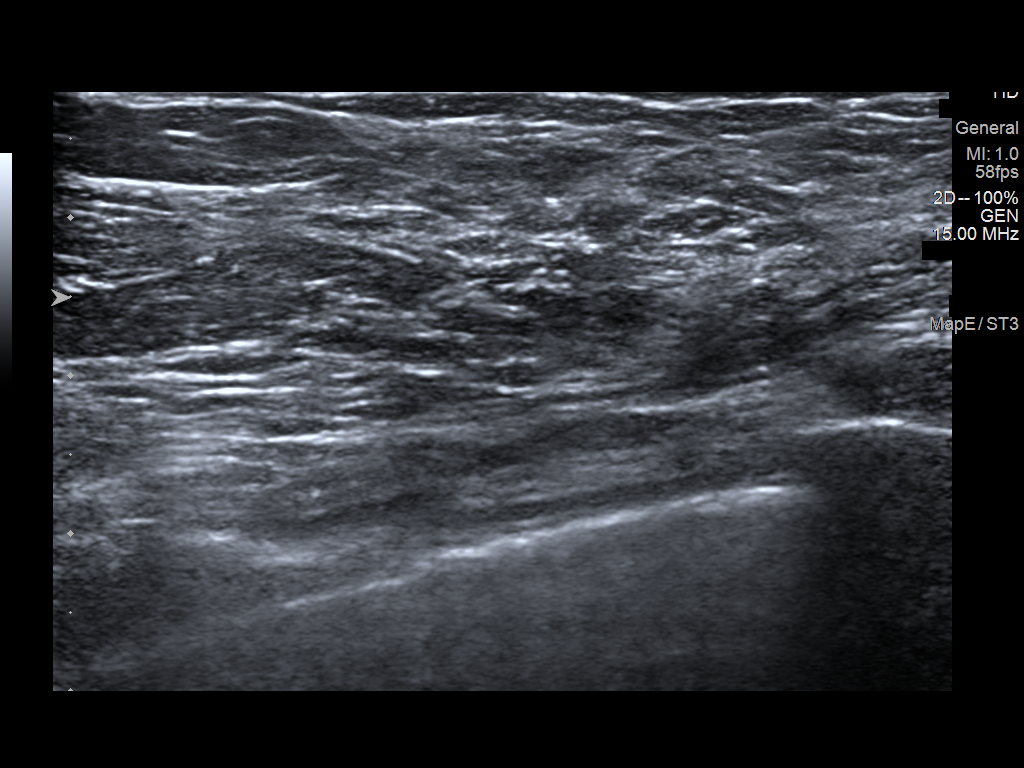
[im 3/6]
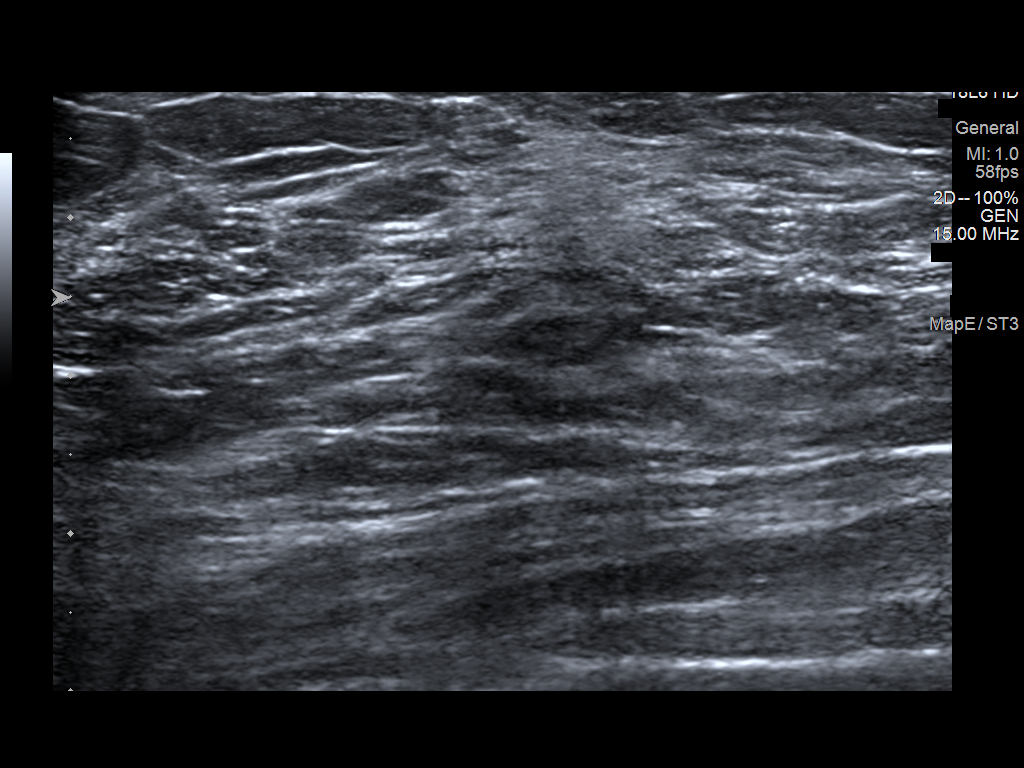
[im 4/6]
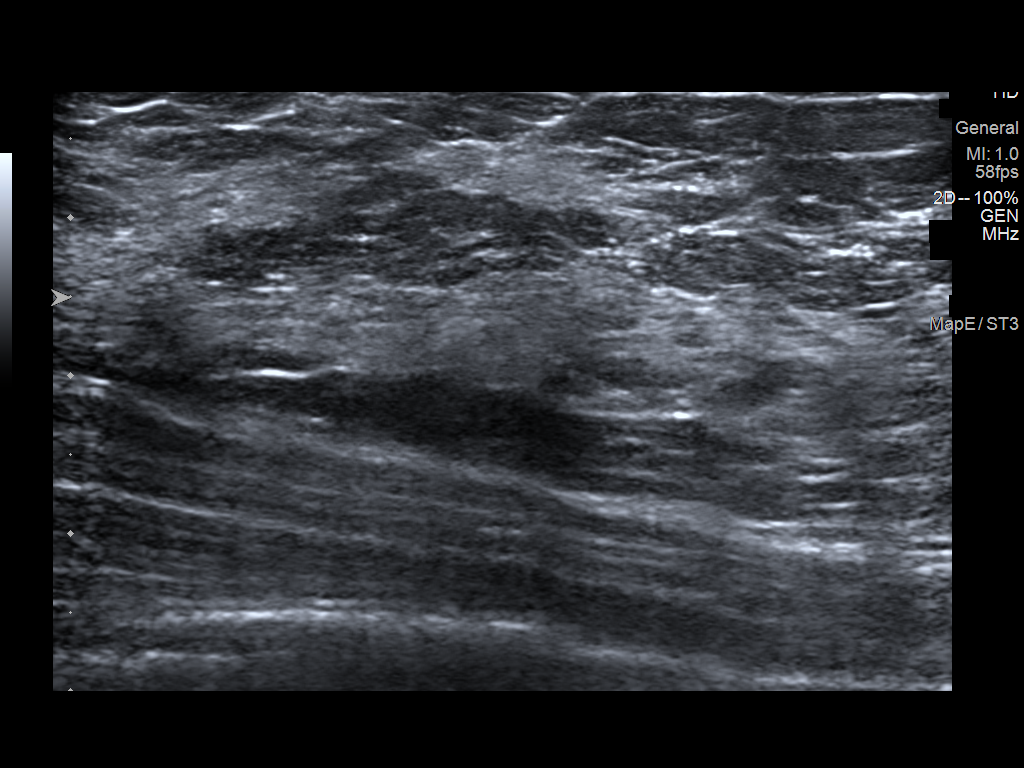
[im 5/6]
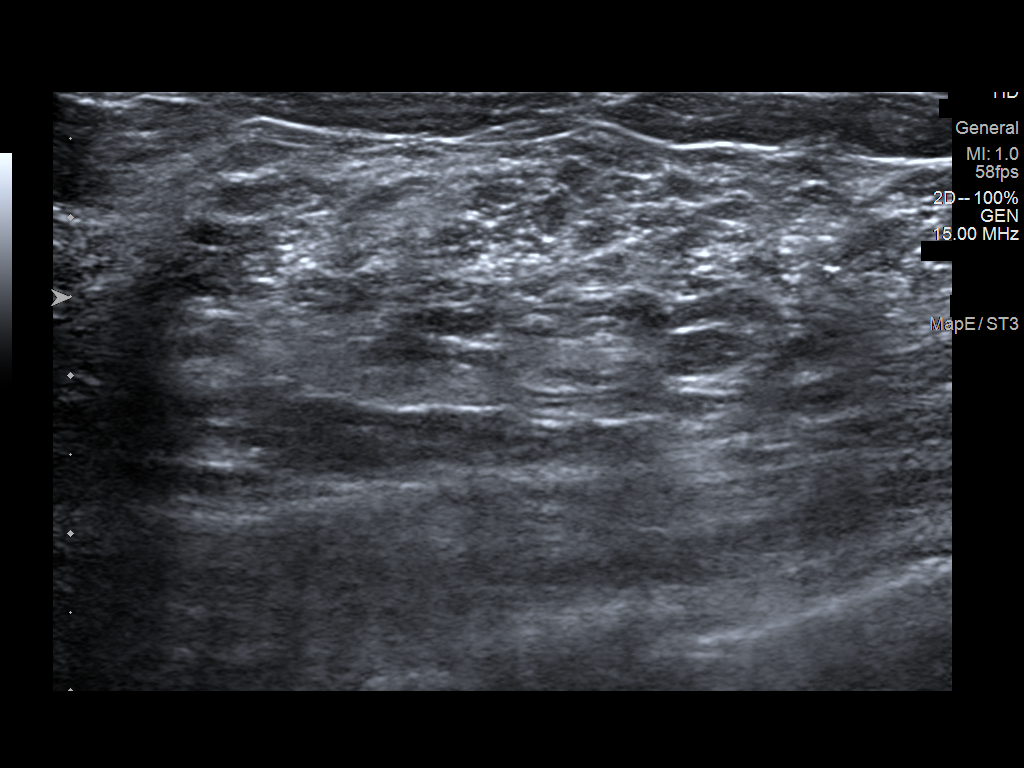
[im 6/6]
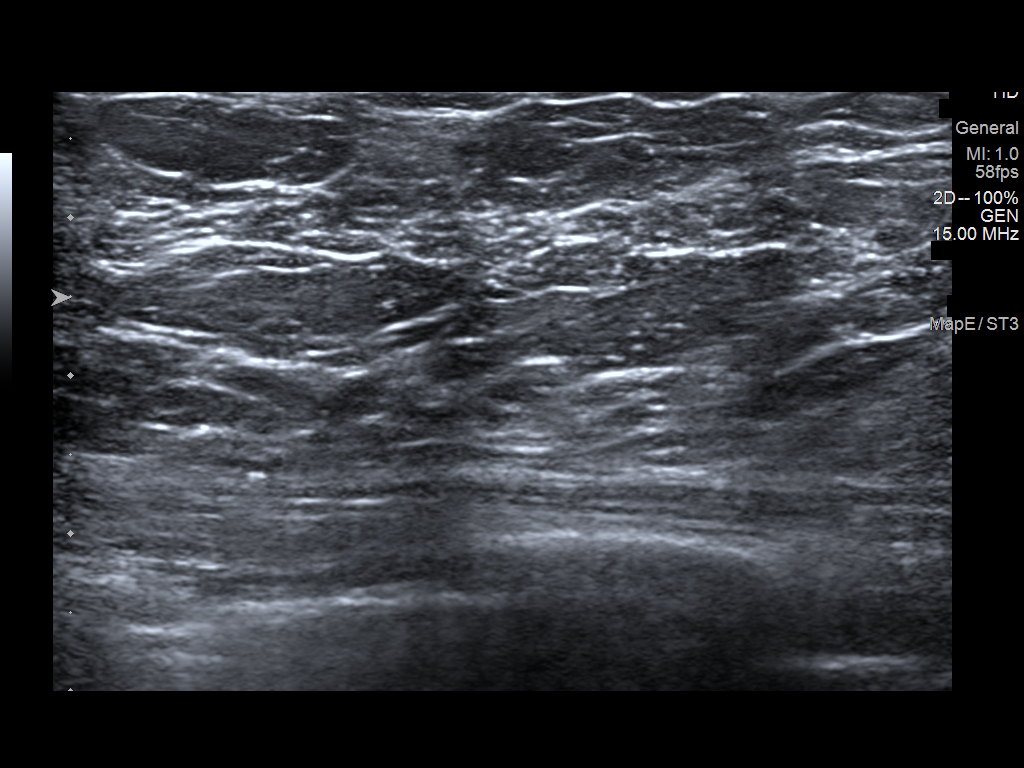

[6 of 6 positions shown; findings below may reference images not displayed]

ACR Breast Density Category b: There are scattered areas of
fibroglandular density.
FINDINGS: On today's additional diagnostic views, including spot compression
with 3D tomosynthesis, the questioned asymmetry within the outer
LEFT breast is compatible with an island of normal dense
fibroglandular tissue. Ultrasound will be performed to ensure
benignity.

Targeted ultrasound is performed, evaluating the outer LEFT breast,
showing no solid or cystic mass. There are multiple islands of
normal dense fibroglandular tissue, corresponding to the
mammographic appearance.
IMPRESSION: No evidence of malignancy.

Patient may return to routine annual bilateral screening mammogram
schedule.

RECOMMENDATION:
Screening mammogram in one year.(Code:SG-J-ONE)

I have discussed the findings and recommendations with the patient.
If applicable, a reminder letter will be sent to the patient
regarding the next appointment.

BI-RADS CATEGORY  1: Negative.

## 2023-02-06 IMAGING — MG MM DIGITAL DIAGNOSTIC UNILAT*L* W/ TOMO W/ CAD
4 series · 4 of 12 positions shown · non-contrast
Comparison: Previous exams including recent screening mammogram
dated 12/19/2020.

CLINICAL DATA: Patient returns today to evaluate a possible LEFT
breast asymmetry questioned on recent screening mammogram.

EXAM:
DIGITAL DIAGNOSTIC UNILATERAL LEFT MAMMOGRAM WITH TOMOSYNTHESIS AND
CAD; ULTRASOUND LEFT BREAST LIMITED
TECHNIQUE: Left digital diagnostic mammography and breast tomosynthesis was
performed. The images were evaluated with computer-aided detection.;
Targeted ultrasound examination of the left breast was performed.

[L ML synth-2D]
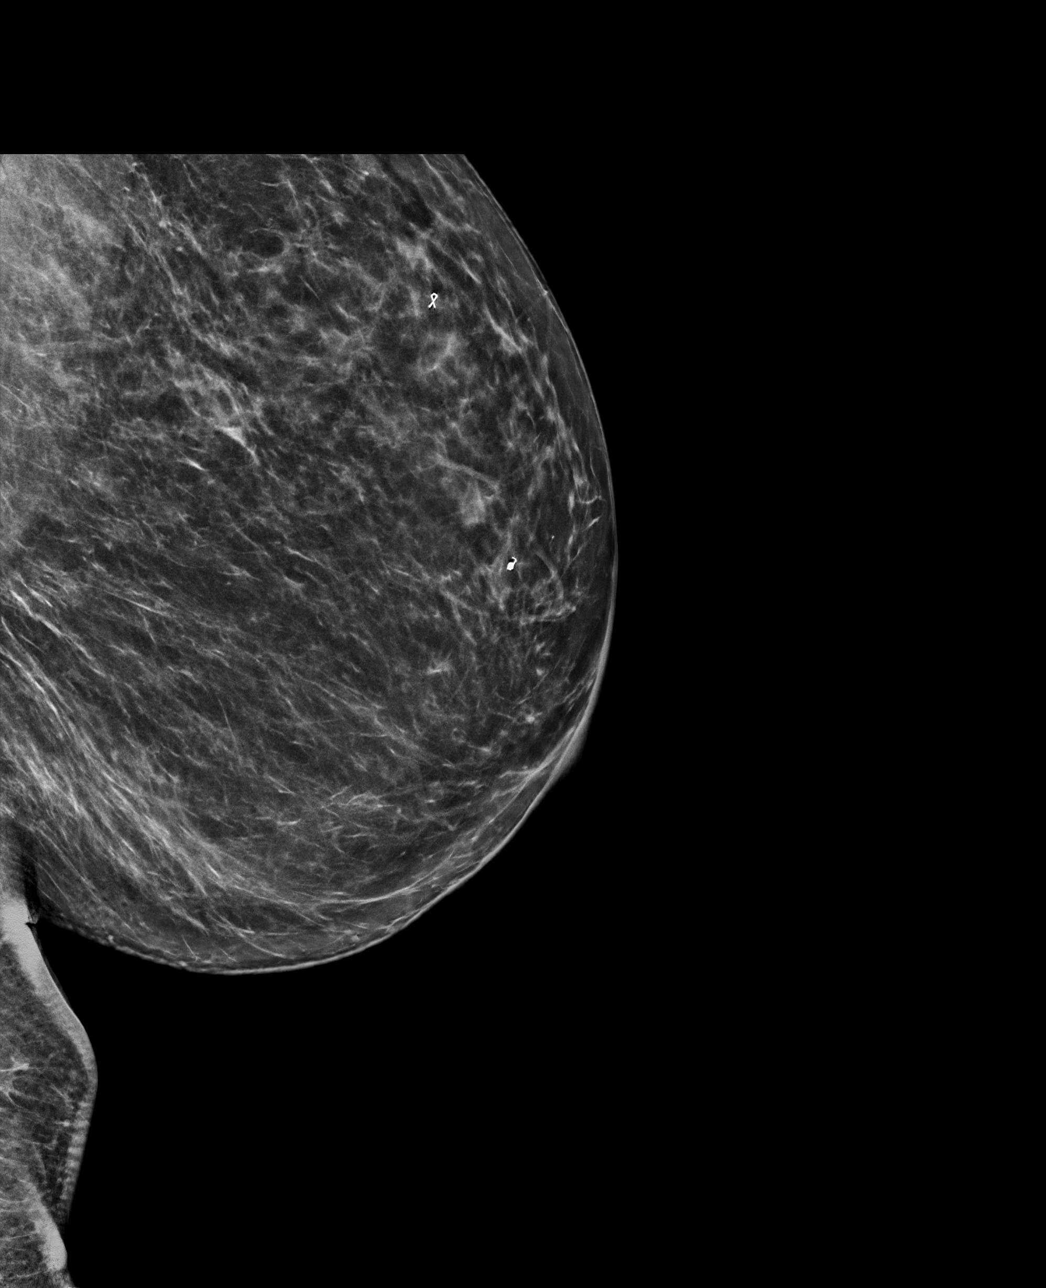

[L CC synth-2D]
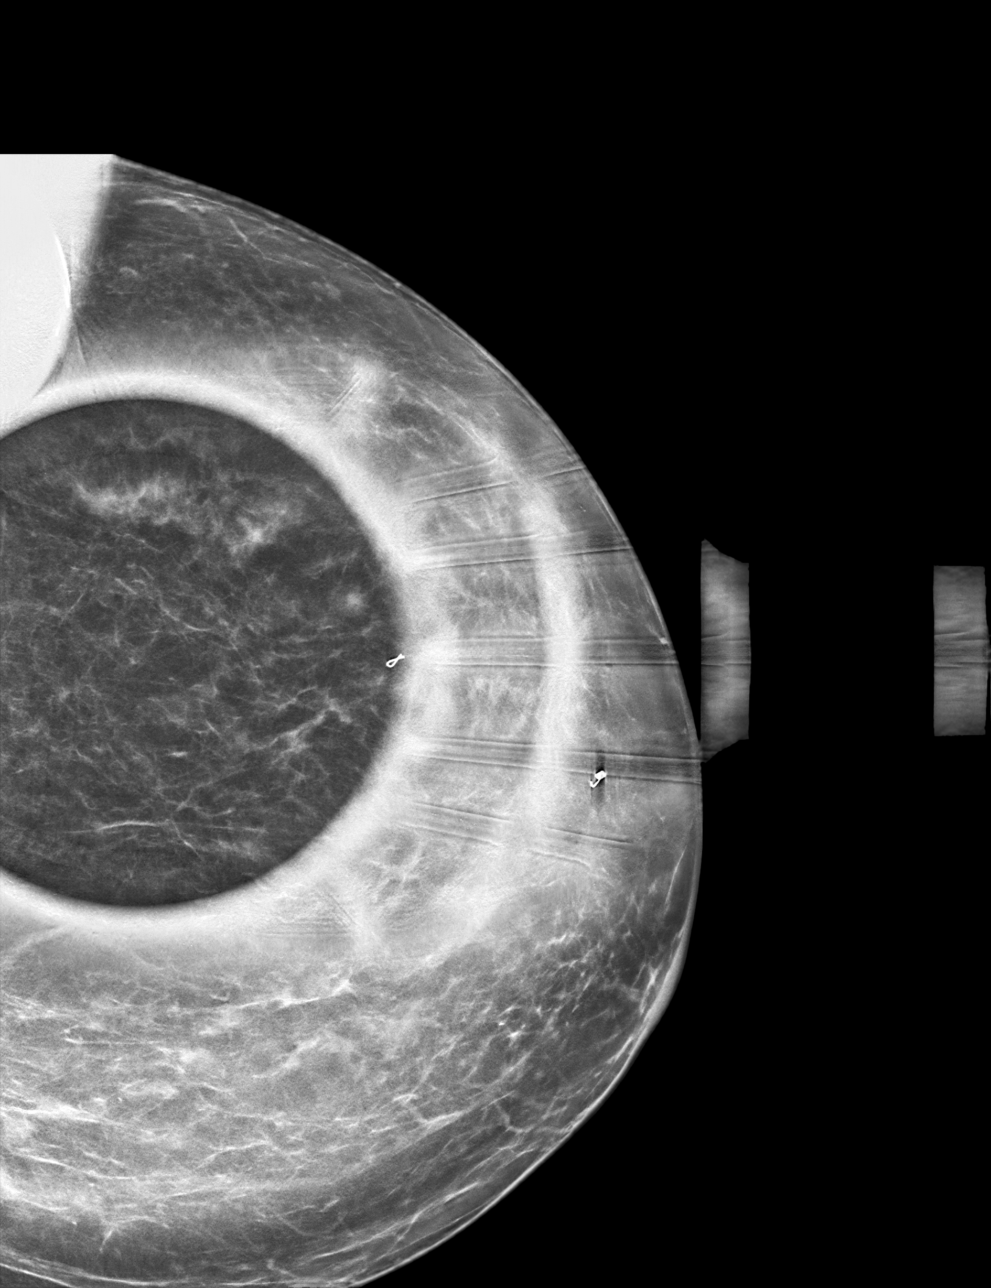

[L CC tomo · tomo slice 31/62.0]
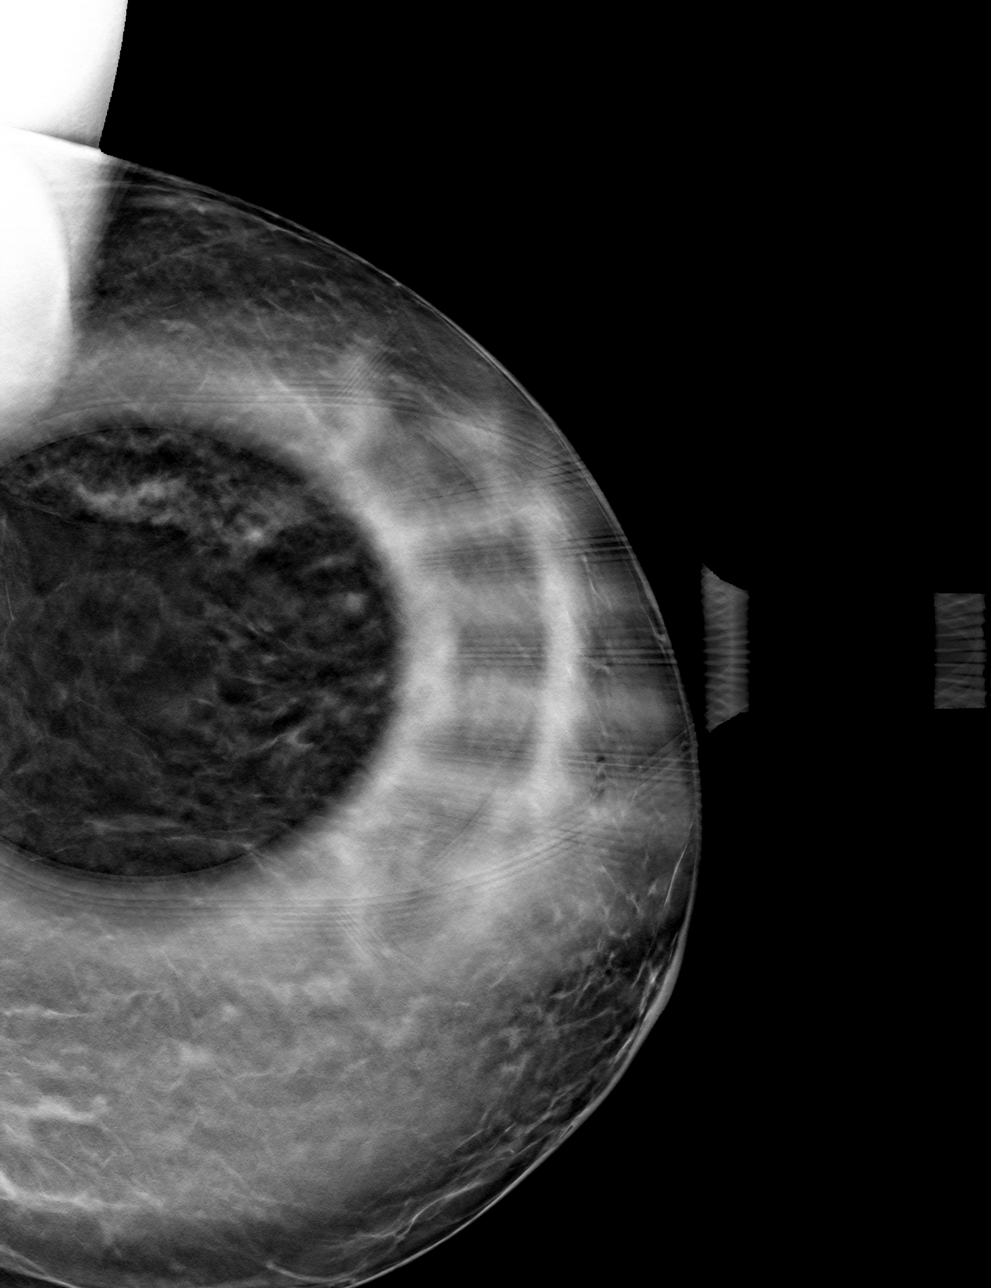

[L ML tomo · tomo slice 37/72.0]
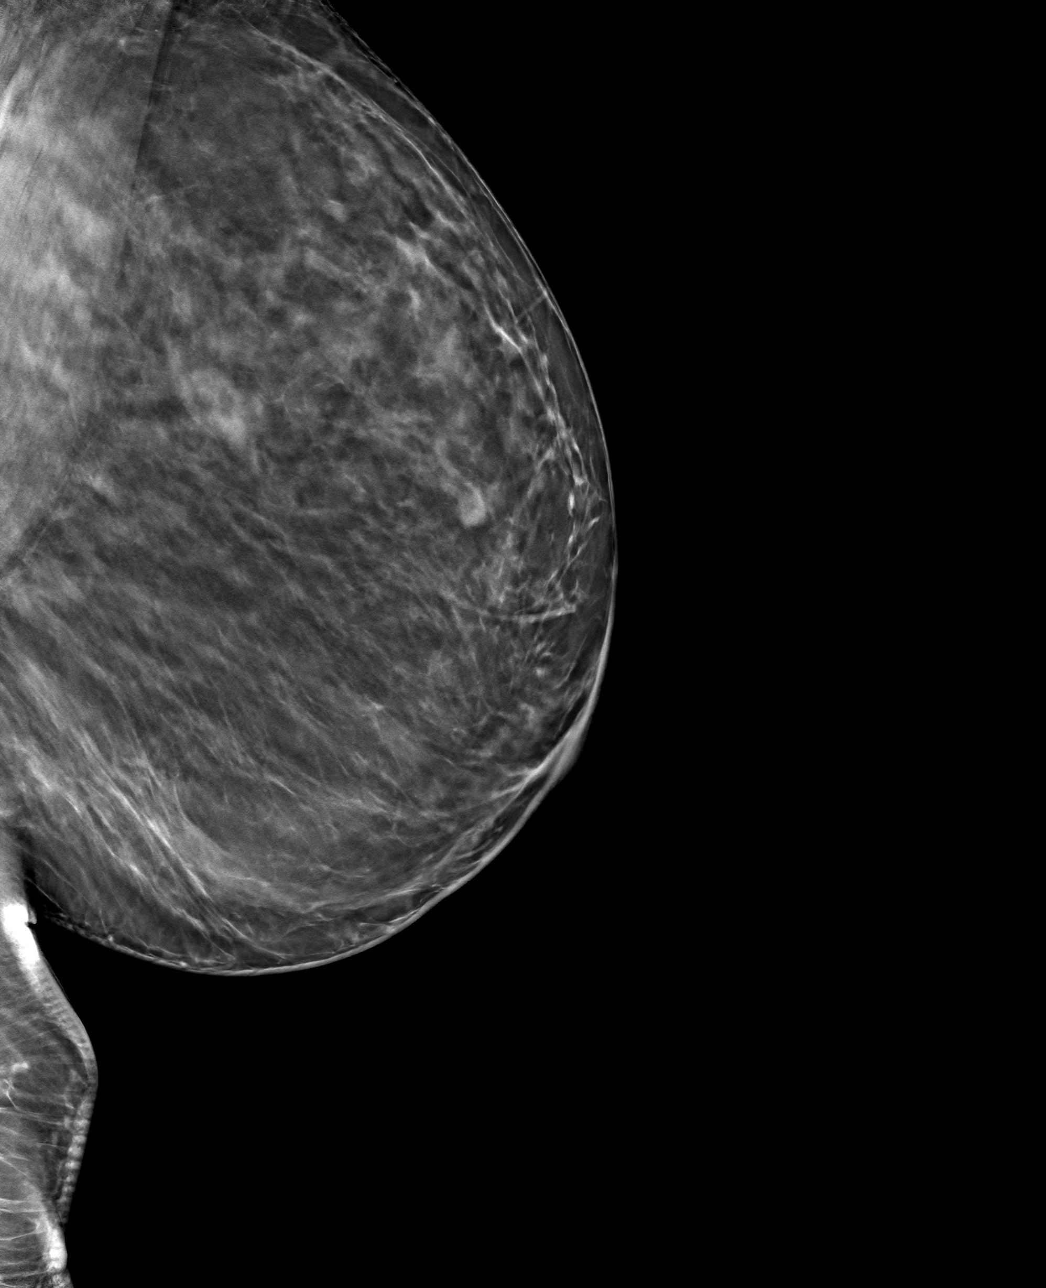

[4 of 12 positions shown; findings below may reference images not displayed]

ACR Breast Density Category b: There are scattered areas of
fibroglandular density.
FINDINGS: On today's additional diagnostic views, including spot compression
with 3D tomosynthesis, the questioned asymmetry within the outer
LEFT breast is compatible with an island of normal dense
fibroglandular tissue. Ultrasound will be performed to ensure
benignity.

Targeted ultrasound is performed, evaluating the outer LEFT breast,
showing no solid or cystic mass. There are multiple islands of
normal dense fibroglandular tissue, corresponding to the
mammographic appearance.
IMPRESSION: No evidence of malignancy.

Patient may return to routine annual bilateral screening mammogram
schedule.

RECOMMENDATION:
Screening mammogram in one year.(Code:SG-J-ONE)

I have discussed the findings and recommendations with the patient.
If applicable, a reminder letter will be sent to the patient
regarding the next appointment.

BI-RADS CATEGORY  1: Negative.

## 2023-02-07 NOTE — Addendum Note (Signed)
Addended by: Jacquenette Shone on: 02/07/2023 04:17 PM   Modules accepted: Orders

## 2023-02-11 LAB — HEPATITIS A ANTIBODY, IGM: Hep A IgM: NEGATIVE

## 2023-02-13 ENCOUNTER — Other Ambulatory Visit: Payer: Self-pay | Admitting: Family Medicine

## 2023-02-13 DIAGNOSIS — Z1231 Encounter for screening mammogram for malignant neoplasm of breast: Secondary | ICD-10-CM

## 2023-02-27 ENCOUNTER — Other Ambulatory Visit: Payer: Self-pay | Admitting: Family Medicine

## 2023-02-27 NOTE — Telephone Encounter (Signed)
Has appt on 03/03/2023.    Labs in date.  Requested Prescriptions  Pending Prescriptions Disp Refills   hydrochlorothiazide (HYDRODIURIL) 25 MG tablet [Pharmacy Med Name: hydroCHLOROthiazide 25 MG Oral Tablet] 90 tablet 0    Sig: Take 1 tablet by mouth once daily     Cardiovascular: Diuretics - Thiazide Failed - 02/27/2023  2:54 PM      Failed - Valid encounter within last 6 months    Recent Outpatient Visits           8 months ago Primary hypertension   [place] [facility] [place], Marzella Schlein, MD   1 year ago Primary hypertension   [place] [facility] [place], Marzella Schlein, MD   1 year ago Primary hypertension   [place] [facility] [place], Marzella Schlein, MD   1 year ago Annual physical exam   [place] [facility] [place], Marzella Schlein, MD   2 years ago Annual physical exam    [facility] [place], Marzella Schlein, MD       Future Appointments             In 4 days [place], Marzella Schlein, MD [facility]            Passed - Cr in normal range and within 180 days    Creat  Date Value Ref Range Status  01/13/2017 0.99 0.50 - 1.10 mg/dL Final   Creatinine, Ser  Date Value Ref Range Status  02/03/2023 0.96 0.57 - 1.00 mg/dL Final         Passed - K in normal range and within 180 days    Potassium  Date Value Ref Range Status  02/03/2023 3.9 3.5 - 5.2 mmol/L Final         Passed - Na in normal range and within 180 days    Sodium  Date Value Ref Range Status  02/03/2023 137 134 - 144 mmol/L Final         Passed - Last BP in normal range    BP Readings from Last 1 Encounters:  06/20/22 130/82

## 2023-03-03 ENCOUNTER — Encounter: Payer: Self-pay | Admitting: Family Medicine

## 2023-03-03 ENCOUNTER — Telehealth: Payer: Self-pay

## 2023-03-03 ENCOUNTER — Ambulatory Visit (INDEPENDENT_AMBULATORY_CARE_PROVIDER_SITE_OTHER): Payer: 59 | Admitting: Family Medicine

## 2023-03-03 VITALS — BP 131/89 | HR 95 | Ht 66.0 in | Wt 165.0 lb

## 2023-03-03 DIAGNOSIS — Z13220 Encounter for screening for lipoid disorders: Secondary | ICD-10-CM | POA: Diagnosis not present

## 2023-03-03 DIAGNOSIS — I10 Essential (primary) hypertension: Secondary | ICD-10-CM | POA: Diagnosis not present

## 2023-03-03 DIAGNOSIS — D72819 Decreased white blood cell count, unspecified: Secondary | ICD-10-CM

## 2023-03-03 DIAGNOSIS — R7989 Other specified abnormal findings of blood chemistry: Secondary | ICD-10-CM

## 2023-03-03 DIAGNOSIS — F3170 Bipolar disorder, currently in remission, most recent episode unspecified: Secondary | ICD-10-CM

## 2023-03-03 DIAGNOSIS — Z1211 Encounter for screening for malignant neoplasm of colon: Secondary | ICD-10-CM | POA: Diagnosis not present

## 2023-03-03 DIAGNOSIS — Z0000 Encounter for general adult medical examination without abnormal findings: Secondary | ICD-10-CM

## 2023-03-03 DIAGNOSIS — L989 Disorder of the skin and subcutaneous tissue, unspecified: Secondary | ICD-10-CM

## 2023-03-03 NOTE — Assessment & Plan Note (Signed)
Blood pressure well-controlled on hydrochlorothiazide 25 mg daily. - Continue hydrochlorothiazide 25 mg daily - Schedule follow-up in six months for blood pressure monitoring

## 2023-03-03 NOTE — Assessment & Plan Note (Signed)
Well-managed on Lamictal 100 mg at bedtime with no mood disturbances. - Continue Lamictal 100 mg at bedtime

## 2023-03-03 NOTE — Assessment & Plan Note (Signed)
Elevated liver enzymes at lowest levels. Previous workup negative. Suspected medication reaction. - Encourage adequate hydration - Advise avoiding excessive Tylenol and alcohol - Recommend ibuprofen for pain management

## 2023-03-03 NOTE — Progress Notes (Signed)
Complete physical exam   Patient: Lisa Knox   DOB: 16-Oct-1980   43 y.o. Female  MRN: [id number] Visit Date: 03/03/2023  Today's healthcare provider: Shirlee Latch, MD   Chief Complaint  Patient presents with   Annual Exam   Subjective    Lisa Knox is a 43 y.o. female who presents today for a complete physical exam.  She reports consuming a general diet.  She generally feels well. She reports sleeping well. She does not have additional problems to discuss today.    Discussed the use of AI scribe software for clinical note transcription with the patient, who gave verbal consent to proceed.  History of Present Illness   The patient, a [employer]ner, broadcasting/film/video, presents for a physical examination. The patient has a concern about a skin lesion that has been oozing pus for about a week. The lesion was initially asymptomatic but became symptomatic after the patient accidentally injured it while moving boxes. The patient has been managing the lesion with iodine and bandages.  The patient also mentions that she is due for a mammogram. The patient has a history of elevated liver function tests, which have been stable and are currently at some of the lowest levels seen. The patient is on hydrochlorothiazide and Lamictal, which have been effective in managing her conditions. The patient also mentions that she is due for colon cancer screening.        Last depression screening scores    03/03/2023    9:16 AM 12/14/2021    8:46 AM 12/11/2020   10:10 AM  PHQ 2/9 Scores  PHQ - 2 Score 0 0 0  PHQ- 9 Score 0 0 0   Last fall risk screening    03/03/2023    9:16 AM  Fall Risk   Falls in the past year? 0  Number falls in past yr: 0  Injury with Fall? 0        Medications: Outpatient Medications Prior to Visit  Medication Sig   cetirizine (ZYRTEC) 10 MG tablet Take 10 mg by mouth daily as needed for allergies.   ferrous sulfate 325 (65 FE) MG tablet Take 325 mg by mouth in the morning  and at bedtime.   fluticasone (FLONASE) 50 MCG/ACT nasal spray Place 2 sprays into both nostrils daily. (Patient taking differently: Place 2 sprays into both nostrils daily as needed for allergies.)   hydrochlorothiazide (HYDRODIURIL) 25 MG tablet Take 1 tablet by mouth once daily   ibuprofen (ADVIL) 800 MG tablet Take 1 tablet (800 mg total) by mouth every 8 (eight) hours as needed.   lamoTRIgine (LAMICTAL) 100 MG tablet TAKE 1 TABLET BY MOUTH AT BEDTIME   Multiple Vitamin (MULTIVITAMIN WITH MINERALS) TABS tablet Take 1 tablet by mouth in the morning. Women's One A Day   No facility-administered medications prior to visit.    Review of Systems    Objective    BP 131/89 (BP Location: Left Arm, Patient Position: Sitting, Cuff Size: Normal)   Pulse 95   Ht 5\' 6"  (1.676 m)   Wt 165 lb (74.8 kg)   LMP 02/19/2023   SpO2 100%   BMI 26.63 kg/m    Physical Exam Vitals reviewed.  Constitutional:      General: She is not in acute distress.    Appearance: Normal appearance. She is well-developed. She is not diaphoretic.  HENT:     Head: Normocephalic and atraumatic.     Right Ear: Tympanic membrane, ear canal  and external ear normal.     Left Ear: Tympanic membrane, ear canal and external ear normal.     Nose: Nose normal.     Mouth/Throat:     Mouth: Mucous membranes are moist.     Pharynx: Oropharynx is clear. No oropharyngeal exudate.  Eyes:     General: No scleral icterus.    Conjunctiva/sclera: Conjunctivae normal.     Pupils: Pupils are equal, round, and reactive to light.  Neck:     Thyroid: No thyromegaly.  Cardiovascular:     Rate and Rhythm: Normal rate and regular rhythm.     Heart sounds: Normal heart sounds. No murmur heard. Pulmonary:     Effort: Pulmonary effort is normal. No respiratory distress.     Breath sounds: Normal breath sounds. No wheezing or rales.  Abdominal:     General: There is no distension.     Palpations: Abdomen is soft.     Tenderness:  There is no abdominal tenderness.  Musculoskeletal:        General: No deformity.     Cervical back: Neck supple.     Right lower leg: No edema.     Left lower leg: No edema.  Lymphadenopathy:     Cervical: No cervical adenopathy.  Skin:    General: Skin is warm and dry.     Findings: Rash (hyperpigmented patch on anterior epigastric area with central papule. No fluctuance, drainage. slight induration. No erythema. No TTP.) present.  Neurological:     Mental Status: She is alert and oriented to person, place, and time. Mental status is at baseline.     Gait: Gait normal.  Psychiatric:        Mood and Affect: Mood normal.        Behavior: Behavior normal.        Thought Content: Thought content normal.      No results found for any visits on 03/03/23.  Assessment & Plan    Routine Health Maintenance and Physical Exam  Exercise Activities and Dietary recommendations  Goals   None     Immunization History  Administered Date(s) Administered   Hepatitis B, PED/ADOLESCENT 08/04/1996, 11/08/1996, 04/08/1997   Influenza,inj,Quad PF,6+ Mos 01/30/2015, 01/13/2017, 01/14/2018, 10/15/2018, 12/06/2019, 12/11/2020, 12/14/2021   MMR 08/04/1996   PFIZER(Purple Top)SARS-COV-2 Vaccination 04/13/2019, 05/12/2019, 01/05/2020   Pfizer Covid-19 Vaccine Bivalent Booster 56yrs & up 11/23/2020   Td 09/23/1995   Tdap 10/28/2011, 12/14/2021   Typhoid Live 01/17/1999   Varicella 01/17/1999   Yellow Fever 01/18/1999    Health Maintenance  Topic Date Due   COVID-19 Vaccine (5 - 2024-25 season) 10/13/2022   Cervical Cancer Screening (HPV/Pap Cotest)  04/23/2027   DTaP/Tdap/Td (4 - Td or Tdap) 12/15/2031   INFLUENZA VACCINE  Completed   Hepatitis C Screening  Completed   HIV Screening  Completed   HPV VACCINES  Aged Out    Discussed health benefits of physical activity, and encouraged her to engage in regular exercise appropriate for her age and condition.  Problem List Items Addressed  This Visit       Cardiovascular and Mediastinum   Hypertension   Blood pressure well-controlled on hydrochlorothiazide 25 mg daily. - Continue hydrochlorothiazide 25 mg daily - Schedule follow-up in six months for blood pressure monitoring        Other   Affective bipolar disorder (HCC)   Well-managed on Lamictal 100 mg at bedtime with no mood disturbances. - Continue Lamictal 100 mg at bedtime  Elevated LFTs   Elevated liver enzymes at lowest levels. Previous workup negative. Suspected medication reaction. - Encourage adequate hydration - Advise avoiding excessive Tylenol and alcohol - Recommend ibuprofen for pain management      Other Visit Diagnoses       Encounter for annual physical exam    -  Primary   Relevant Orders   Lipid panel   CBC w/Diff/Platelet     Colon cancer screening       Relevant Orders   Ambulatory referral to Gastroenterology     Leukopenia, unspecified type       Relevant Orders   CBC w/Diff/Platelet     Screening for lipid disorders       Relevant Orders   Lipid panel     Skin lesion       Relevant Orders   Ambulatory referral to Dermatology           Epidermal Inclusion Cyst Cyst on the abdominal wall for about a week following minor injury, oozing pus. Suspected inflamed and draining epidermal inclusion cyst. No active infection currently - Refer to dermatology for evaluation and possible excision - Advise applying warm compress to encourage drainage  General Health Maintenance Up to date on most items. Mammogram scheduled for tomorrow. Due for colonoscopy as turning 43 this year. Discussed colonoscopy prep and benefits of early detection. - Order lipid panel and CBC - Refer to [facility] for colonoscopy in July or August  Follow-up - Schedule six-month follow-up for blood pressure monitoring - Ensure dermatology referral for cyst evaluation - Follow up with [facility] for colonoscopy scheduling.        Return in  about 6 months (around 08/31/2023) for chronic disease f/u.     Shirlee Latch, MD  [facility] [phone] (phone) [phone] (fax)  [facility]

## 2023-03-03 NOTE — Telephone Encounter (Signed)
Gastroenterology Pre-Procedure Review  Request Date: TBD Requesting Physician: Dr. Jodelle Gross  PATIENT REVIEW QUESTIONS: The patient responded to the following health history questions as indicated:    1. Are you having any GI issues? no 2. Do you have a personal history of Polyps? no 3. Do you have a family history of Colon Cancer or Polyps? no 4. Diabetes Mellitus? no 5. Joint replacements in the past 12 months?no 6. Major health problems in the past 3 months?no 7. Any artificial heart valves, MVP, or defibrillator?no    MEDICATIONS & ALLERGIES:    Patient reports the following regarding taking any anticoagulation/antiplatelet therapy:   Plavix, Coumadin, Eliquis, Xarelto, Lovenox, Pradaxa, Brilinta, or Effient? no Aspirin? no  Patient confirms/reports the following medications:  Current Outpatient Medications  Medication Sig Dispense Refill   cetirizine (ZYRTEC) 10 MG tablet Take 10 mg by mouth daily as needed for allergies.     ferrous sulfate 325 (65 FE) MG tablet Take 325 mg by mouth in the morning and at bedtime.     fluticasone (FLONASE) 50 MCG/ACT nasal spray Place 2 sprays into both nostrils daily. (Patient taking differently: Place 2 sprays into both nostrils daily as needed for allergies.) 48 g 3   hydrochlorothiazide (HYDRODIURIL) 25 MG tablet Take 1 tablet by mouth once daily 90 tablet 0   ibuprofen (ADVIL) 800 MG tablet Take 1 tablet (800 mg total) by mouth every 8 (eight) hours as needed. 30 tablet 0   lamoTRIgine (LAMICTAL) 100 MG tablet TAKE 1 TABLET BY MOUTH AT BEDTIME 90 tablet 1   Multiple Vitamin (MULTIVITAMIN WITH MINERALS) TABS tablet Take 1 tablet by mouth in the morning. Women's One A Day     No current facility-administered medications for this visit.    Patient confirms/reports the following allergies:  Allergies  Allergen Reactions   Pollen Extract Other (See Comments)    Pet dander- Hives   Shellfish Allergy Hives    No orders of the defined types were  placed in this encounter.   AUTHORIZATION INFORMATION Primary Insurance: 1D#: Group #:  Secondary Insurance: 1D#: Group #:  SCHEDULE INFORMATION: Date:  Time: Location:

## 2023-03-04 ENCOUNTER — Encounter: Payer: Self-pay | Admitting: Family Medicine

## 2023-03-04 DIAGNOSIS — Z1231 Encounter for screening mammogram for malignant neoplasm of breast: Secondary | ICD-10-CM

## 2023-03-04 LAB — LIPID PANEL
Chol/HDL Ratio: 2.6 {ratio} (ref 0.0–4.4)
Cholesterol, Total: 200 mg/dL — ABNORMAL HIGH (ref 100–199)
HDL: 77 mg/dL (ref >39)
LDL Chol Calc (NIH): 109 mg/dL — ABNORMAL HIGH (ref 0–99)
Triglycerides: 76 mg/dL (ref 0–149)
VLDL Cholesterol Cal: 14 mg/dL (ref 5–40)

## 2023-03-04 LAB — CBC WITH DIFFERENTIAL/PLATELET
Basophils Absolute: 0.1 10*3/uL (ref 0.0–0.2)
Basos: 1 %
EOS (ABSOLUTE): 0.2 10*3/uL (ref 0.0–0.4)
Eos: 4 %
Hematocrit: 38.8 % (ref 34.0–46.6)
Hemoglobin: 12.7 g/dL (ref 11.1–15.9)
Immature Grans (Abs): 0 10*3/uL (ref 0.0–0.1)
Immature Granulocytes: 1 %
Lymphocytes Absolute: 1.7 10*3/uL (ref 0.7–3.1)
Lymphs: 33 %
MCH: 30.9 pg (ref 26.6–33.0)
MCHC: 32.7 g/dL (ref 31.5–35.7)
MCV: 94 fL (ref 79–97)
Monocytes Absolute: 0.6 10*3/uL (ref 0.1–0.9)
Monocytes: 11 %
Neutrophils Absolute: 2.7 10*3/uL (ref 1.4–7.0)
Neutrophils: 50 %
Platelets: 358 10*3/uL (ref 150–450)
RBC: 4.11 x10E6/uL (ref 3.77–5.28)
RDW: 13 % (ref 11.7–15.4)
WBC: 5.2 10*3/uL (ref 3.4–10.8)

## 2023-03-18 ENCOUNTER — Other Ambulatory Visit: Payer: Self-pay | Admitting: Family Medicine

## 2023-03-18 DIAGNOSIS — Z1231 Encounter for screening mammogram for malignant neoplasm of breast: Secondary | ICD-10-CM

## 2023-03-19 ENCOUNTER — Ambulatory Visit
Admission: RE | Admit: 2023-03-19 | Discharge: 2023-03-19 | Discharge status: Home / Self Care | Payer: 59 | Source: Ambulatory Visit

## 2023-03-19 DIAGNOSIS — Z1231 Encounter for screening mammogram for malignant neoplasm of breast: Secondary | ICD-10-CM

## 2023-03-24 ENCOUNTER — Encounter: Payer: Self-pay | Admitting: Family Medicine

## 2023-06-02 ENCOUNTER — Other Ambulatory Visit: Payer: Self-pay | Admitting: Family Medicine

## 2023-07-11 ENCOUNTER — Telehealth: Payer: Self-pay | Admitting: Family Medicine

## 2023-07-11 ENCOUNTER — Other Ambulatory Visit: Payer: Self-pay

## 2023-07-11 MED ORDER — HYDROCHLOROTHIAZIDE 25 MG PO TABS: 25.0000 mg | ORAL_TABLET | Freq: Every day | ORAL | 0 refills | Status: DC

## 2023-07-11 NOTE — Telephone Encounter (Signed)
[facility] faxed refill request for the following medications:  hydrochlorothiazide (HYDRODIURIL) 25 MG tablet    Please advise.  

## 2023-09-01 ENCOUNTER — Encounter: Payer: Self-pay | Admitting: Family Medicine

## 2023-09-01 ENCOUNTER — Ambulatory Visit: Payer: Self-pay | Admitting: Family Medicine

## 2023-09-01 VITALS — BP 122/80 | HR 82 | Ht 66.0 in | Wt 168.6 lb

## 2023-09-01 DIAGNOSIS — R748 Abnormal levels of other serum enzymes: Secondary | ICD-10-CM | POA: Diagnosis not present

## 2023-09-01 DIAGNOSIS — I10 Essential (primary) hypertension: Secondary | ICD-10-CM | POA: Diagnosis not present

## 2023-09-01 MED ORDER — HYDROCHLOROTHIAZIDE 25 MG PO TABS: 25.0000 mg | ORAL_TABLET | Freq: Every day | ORAL | 1 refills | Status: DC

## 2023-09-01 NOTE — Assessment & Plan Note (Signed)
 Continue to monitor

## 2023-09-01 NOTE — Assessment & Plan Note (Signed)
 Blood pressure was elevated upon arrival but is well-controlled at home with hydrochlorothiazide  25 mg. - Refill hydrochlorothiazide  25 mg prescription and send to [facility] in [facility]. - Recheck blood pressure during the visit. - Schedule a physical examination in six months. - Perform blood draw today to check liver and kidney function.

## 2023-09-01 NOTE — Progress Notes (Signed)
 Established patient visit   Patient: Lisa Knox   DOB: [birth year]   43 y.o. Female  MRN: [id number] Visit Date: 09/01/2023  Today's healthcare provider: Jon Eva, MD   Chief Complaint  Patient presents with   Medical Management of Chronic Issues   Hypertension    She reports taking medications as prescribed and monitoring at home with no symptoms to report. She reports readings typically 130's/80's   Care Management    HPV Vaccines - ok to order Colonoscopy - Referral to GI 03/03/23   Subjective    Hypertension   HPI     Hypertension    Additional comments: She reports taking medications as prescribed and monitoring at home with no symptoms to report. She reports readings typically 130's/80's        Care Management    Additional comments: HPV Vaccines - ok to order Colonoscopy - Referral to GI 03/03/23      Last edited by Lilian Fitzpatrick, CMA on 09/01/2023 10:34 AM.       Discussed the use of AI scribe software for clinical note transcription with the patient, who gave verbal consent to proceed.  History of Present Illness   Lisa Knox is a 43 year old female who presents for a follow-up visit regarding hypertension management and preventive health screenings.  Her blood pressure is well-controlled at home but slightly elevated in the clinic, attributed to a relaxed home environment during the summer. She takes hydrochlorothiazide  25 mg daily with no new symptoms or concerns related to hypertension.  She is considering the HPV vaccine as she approaches the upper age limit for vaccination. She is sexually active with one partner, has been HPV negative on previous PAP tests, and is monogamous.  She is due for colon cancer screening at age 24 and has contacted a clinic to schedule a colonoscopy. She faces scheduling challenges due to staffing issues and is considering scheduling during a school break.         Medications: Outpatient  Medications Prior to Visit  Medication Sig   cetirizine (ZYRTEC) 10 MG tablet Take 10 mg by mouth daily as needed for allergies.   ferrous sulfate 325 (65 FE) MG tablet Take 325 mg by mouth in the morning and at bedtime.   fluticasone  (FLONASE ) 50 MCG/ACT nasal spray Place 2 sprays into both nostrils daily. (Patient taking differently: Place 2 sprays into both nostrils daily as needed for allergies.)   ibuprofen  (ADVIL ) 800 MG tablet Take 1 tablet (800 mg total) by mouth every 8 (eight) hours as needed.   lamoTRIgine  (LAMICTAL ) 100 MG tablet TAKE 1 TABLET BY MOUTH AT BEDTIME   Multiple Vitamin (MULTIVITAMIN WITH MINERALS) TABS tablet Take 1 tablet by mouth in the morning. Women's One A Day   [DISCONTINUED] hydrochlorothiazide  (HYDRODIURIL ) 25 MG tablet Take 1 tablet (25 mg total) by mouth daily.   No facility-administered medications prior to visit.    Review of Systems     Objective    BP 122/80 (BP Location: Left Arm, Patient Position: Sitting, Cuff Size: Normal)   Pulse 82   Ht 5' 6 (1.676 m)   Wt 168 lb 9.6 oz (76.5 kg)   SpO2 100%   BMI 27.21 kg/m    Physical Exam Vitals reviewed.  Constitutional:      General: She is not in acute distress.    Appearance: Normal appearance. She is well-developed. She is not diaphoretic.  HENT:     Head:  Normocephalic and atraumatic.  Eyes:     General: No scleral icterus.    Conjunctiva/sclera: Conjunctivae normal.  Neck:     Thyroid: No thyromegaly.  Cardiovascular:     Rate and Rhythm: Normal rate and regular rhythm.     Heart sounds: Normal heart sounds. No murmur heard. Pulmonary:     Effort: Pulmonary effort is normal. No respiratory distress.     Breath sounds: Normal breath sounds. No wheezing, rhonchi or rales.  Musculoskeletal:     Cervical back: Neck supple.     Right lower leg: No edema.     Left lower leg: No edema.  Lymphadenopathy:     Cervical: No cervical adenopathy.  Skin:    General: Skin is warm and dry.      Findings: No rash.  Neurological:     Mental Status: She is alert and oriented to person, place, and time. Mental status is at baseline.  Psychiatric:        Mood and Affect: Mood normal.        Behavior: Behavior normal.      No results found for any visits on 09/01/23.  Assessment & Plan     Problem List Items Addressed This Visit       Cardiovascular and Mediastinum   Hypertension - Primary   Blood pressure was elevated upon arrival but is well-controlled at home with hydrochlorothiazide  25 mg. - Refill hydrochlorothiazide  25 mg prescription and send to [facility] in [facility]. - Recheck blood pressure during the visit. - Schedule a physical examination in six months. - Perform blood draw today to check liver and kidney function.      Relevant Medications   hydrochlorothiazide  (HYDRODIURIL ) 25 MG tablet   Other Relevant Orders   Comprehensive metabolic panel with GFR     Other   Abnormal liver enzymes   Continue to monitor      Relevant Orders   Comprehensive metabolic panel with GFR        General Health Maintenance She is at low risk for HPV due to a monogamous relationship and negative HPV tests. Colonoscopy is recommended over Cologuard for colon cancer screening due to a lower false positive rate and a 10-year screening interval if normal. - Do not administer HPV vaccine as she prefers to skip it. Aging out soon. - Encourage scheduling a colonoscopy, referral already in place. - Consider Cologuard if colonoscopy is not feasible. - Remind to get flu shot in the fall. - Recommend COVID vaccine in the fall with flu shot.        Return in about 6 months (around 03/03/2024) for CPE.       Jon Eva, MD  [facility] [phone] (phone) [phone] (fax)  [facility]

## 2023-09-02 ENCOUNTER — Ambulatory Visit: Payer: Self-pay | Admitting: Family Medicine

## 2023-09-02 LAB — COMPREHENSIVE METABOLIC PANEL WITH GFR
ALT: 31 IU/L (ref 0–32)
AST: 24 IU/L (ref 0–40)
Albumin: 4.2 g/dL (ref 3.9–4.9)
Alkaline Phosphatase: 67 IU/L (ref 44–121)
BUN/Creatinine Ratio: 12 (ref 9–23)
BUN: 12 mg/dL (ref 6–24)
Bilirubin Total: 0.9 mg/dL (ref 0.0–1.2)
CO2: 24 mmol/L (ref 20–29)
Calcium: 9.3 mg/dL (ref 8.7–10.2)
Chloride: 99 mmol/L (ref 96–106)
Creatinine, Ser: 0.99 mg/dL (ref 0.57–1.00)
Globulin, Total: 3.9 g/dL (ref 1.5–4.5)
Glucose: 86 mg/dL (ref 70–99)
Potassium: 3.6 mmol/L (ref 3.5–5.2)
Sodium: 140 mmol/L (ref 134–144)
Total Protein: 8.1 g/dL (ref 6.0–8.5)
eGFR: 72 mL/min/1.73 (ref >59)

## 2023-10-04 ENCOUNTER — Other Ambulatory Visit: Payer: Self-pay | Admitting: Family Medicine

## 2024-01-07 ENCOUNTER — Other Ambulatory Visit: Payer: Self-pay | Admitting: Family Medicine

## 2024-01-21 ENCOUNTER — Telehealth: Payer: Self-pay

## 2024-01-21 NOTE — Telephone Encounter (Signed)
 Contacted patient in regards to health maintenance. Call answered and hung up. If call is returned please advise patient to come by office at her earliest convenience to pick up a [employer] Lyte. This is a point of care screening completed by the patient to detect If any blood in stool versus completing a colonoscopy or cologuard at this moment. If she already has an appointment scheduled or kit at home please let us  know.    Also please verify if influenza vaccine has been received this season and if not would she like to receive one.   Letter sent via mychart in regards to health maintenance gaps.

## 2024-03-01 ENCOUNTER — Encounter: Admitting: Family Medicine

## 2024-03-08 ENCOUNTER — Ambulatory Visit: Admitting: Family Medicine

## 2024-04-06 ENCOUNTER — Ambulatory Visit: Admitting: Family Medicine
